# Patient Record
Sex: Male | Born: 1945 | Race: White | Hispanic: No | Marital: Married | Smoking: Former smoker
Health system: Southern US, Community
[De-identification: ages and names within clinical notes are randomized; demographics above are authoritative.]

## PROBLEM LIST (undated history)

## (undated) DIAGNOSIS — M5030 Other cervical disc degeneration, unspecified cervical region: Secondary | ICD-10-CM

## (undated) DIAGNOSIS — N400 Enlarged prostate without lower urinary tract symptoms: Secondary | ICD-10-CM

## (undated) DIAGNOSIS — D369 Benign neoplasm, unspecified site: Secondary | ICD-10-CM

## (undated) DIAGNOSIS — R972 Elevated prostate specific antigen [PSA]: Secondary | ICD-10-CM

## (undated) DIAGNOSIS — I10 Essential (primary) hypertension: Secondary | ICD-10-CM

## (undated) DIAGNOSIS — T8859XA Other complications of anesthesia, initial encounter: Secondary | ICD-10-CM

## (undated) DIAGNOSIS — E785 Hyperlipidemia, unspecified: Secondary | ICD-10-CM

## (undated) DIAGNOSIS — M720 Palmar fascial fibromatosis [Dupuytren]: Secondary | ICD-10-CM

## (undated) DIAGNOSIS — C801 Malignant (primary) neoplasm, unspecified: Secondary | ICD-10-CM

## (undated) HISTORY — PX: APPENDECTOMY: SHX54

## (undated) HISTORY — PX: FLEXIBLE SIGMOIDOSCOPY: SHX1649

## (undated) HISTORY — PX: ADENOIDECTOMY: SUR15

## (undated) HISTORY — PX: COLONOSCOPY: SHX174

## (undated) HISTORY — PX: ESOPHAGOGASTRODUODENOSCOPY: SHX1529

---

## 2004-10-31 ENCOUNTER — Ambulatory Visit: Payer: Self-pay | Admitting: Otolaryngology

## 2006-05-01 ENCOUNTER — Ambulatory Visit: Payer: Self-pay | Admitting: Unknown Physician Specialty

## 2011-07-18 ENCOUNTER — Ambulatory Visit: Payer: Self-pay | Admitting: Unknown Physician Specialty

## 2011-07-23 LAB — PATHOLOGY REPORT

## 2015-07-18 DIAGNOSIS — I10 Essential (primary) hypertension: Secondary | ICD-10-CM | POA: Diagnosis not present

## 2015-07-18 DIAGNOSIS — E785 Hyperlipidemia, unspecified: Secondary | ICD-10-CM | POA: Diagnosis not present

## 2015-07-18 DIAGNOSIS — Z79899 Other long term (current) drug therapy: Secondary | ICD-10-CM | POA: Diagnosis not present

## 2015-07-18 DIAGNOSIS — E538 Deficiency of other specified B group vitamins: Secondary | ICD-10-CM | POA: Diagnosis not present

## 2015-07-18 DIAGNOSIS — G629 Polyneuropathy, unspecified: Secondary | ICD-10-CM | POA: Diagnosis not present

## 2015-07-25 DIAGNOSIS — E538 Deficiency of other specified B group vitamins: Secondary | ICD-10-CM | POA: Diagnosis not present

## 2015-07-25 DIAGNOSIS — Z0000 Encounter for general adult medical examination without abnormal findings: Secondary | ICD-10-CM | POA: Diagnosis not present

## 2015-07-25 DIAGNOSIS — Z125 Encounter for screening for malignant neoplasm of prostate: Secondary | ICD-10-CM | POA: Diagnosis not present

## 2015-07-25 DIAGNOSIS — N401 Enlarged prostate with lower urinary tract symptoms: Secondary | ICD-10-CM | POA: Diagnosis not present

## 2015-07-25 DIAGNOSIS — M5030 Other cervical disc degeneration, unspecified cervical region: Secondary | ICD-10-CM | POA: Diagnosis not present

## 2015-07-25 DIAGNOSIS — Z23 Encounter for immunization: Secondary | ICD-10-CM | POA: Diagnosis not present

## 2015-07-25 DIAGNOSIS — E785 Hyperlipidemia, unspecified: Secondary | ICD-10-CM | POA: Diagnosis not present

## 2015-07-25 DIAGNOSIS — I10 Essential (primary) hypertension: Secondary | ICD-10-CM | POA: Diagnosis not present

## 2015-07-25 DIAGNOSIS — Z79899 Other long term (current) drug therapy: Secondary | ICD-10-CM | POA: Diagnosis not present

## 2015-08-15 ENCOUNTER — Other Ambulatory Visit: Payer: Self-pay | Admitting: Internal Medicine

## 2015-08-15 DIAGNOSIS — R51 Headache: Secondary | ICD-10-CM | POA: Diagnosis not present

## 2015-08-15 DIAGNOSIS — R519 Headache, unspecified: Secondary | ICD-10-CM

## 2015-08-15 DIAGNOSIS — M5030 Other cervical disc degeneration, unspecified cervical region: Secondary | ICD-10-CM | POA: Diagnosis not present

## 2015-08-16 ENCOUNTER — Ambulatory Visit
Admission: RE | Admit: 2015-08-16 | Discharge: 2015-08-16 | Disposition: A | Discharge status: Home / Self Care | Payer: PPO | Source: Ambulatory Visit | Attending: Internal Medicine | Admitting: Internal Medicine

## 2015-08-16 DIAGNOSIS — R51 Headache: Secondary | ICD-10-CM | POA: Diagnosis not present

## 2015-08-16 DIAGNOSIS — R930 Abnormal findings on diagnostic imaging of skull and head, not elsewhere classified: Secondary | ICD-10-CM | POA: Diagnosis not present

## 2015-08-16 DIAGNOSIS — R519 Headache, unspecified: Secondary | ICD-10-CM

## 2015-08-25 DIAGNOSIS — L0211 Cutaneous abscess of neck: Secondary | ICD-10-CM | POA: Diagnosis not present

## 2015-10-15 DIAGNOSIS — L0211 Cutaneous abscess of neck: Secondary | ICD-10-CM | POA: Diagnosis not present

## 2015-10-15 DIAGNOSIS — X32XXXA Exposure to sunlight, initial encounter: Secondary | ICD-10-CM | POA: Diagnosis not present

## 2015-10-15 DIAGNOSIS — L400 Psoriasis vulgaris: Secondary | ICD-10-CM | POA: Diagnosis not present

## 2015-10-15 DIAGNOSIS — Z08 Encounter for follow-up examination after completed treatment for malignant neoplasm: Secondary | ICD-10-CM | POA: Diagnosis not present

## 2015-10-15 DIAGNOSIS — L309 Dermatitis, unspecified: Secondary | ICD-10-CM | POA: Diagnosis not present

## 2015-10-15 DIAGNOSIS — Z85828 Personal history of other malignant neoplasm of skin: Secondary | ICD-10-CM | POA: Diagnosis not present

## 2015-10-15 DIAGNOSIS — L570 Actinic keratosis: Secondary | ICD-10-CM | POA: Diagnosis not present

## 2015-10-29 DIAGNOSIS — X32XXXA Exposure to sunlight, initial encounter: Secondary | ICD-10-CM | POA: Diagnosis not present

## 2015-10-29 DIAGNOSIS — L570 Actinic keratosis: Secondary | ICD-10-CM | POA: Diagnosis not present

## 2015-10-31 DIAGNOSIS — R6889 Other general symptoms and signs: Secondary | ICD-10-CM | POA: Diagnosis not present

## 2016-01-17 DIAGNOSIS — Z125 Encounter for screening for malignant neoplasm of prostate: Secondary | ICD-10-CM | POA: Diagnosis not present

## 2016-01-17 DIAGNOSIS — I10 Essential (primary) hypertension: Secondary | ICD-10-CM | POA: Diagnosis not present

## 2016-01-17 DIAGNOSIS — Z79899 Other long term (current) drug therapy: Secondary | ICD-10-CM | POA: Diagnosis not present

## 2016-01-17 DIAGNOSIS — E538 Deficiency of other specified B group vitamins: Secondary | ICD-10-CM | POA: Diagnosis not present

## 2016-01-17 DIAGNOSIS — E785 Hyperlipidemia, unspecified: Secondary | ICD-10-CM | POA: Diagnosis not present

## 2016-01-22 DIAGNOSIS — E538 Deficiency of other specified B group vitamins: Secondary | ICD-10-CM | POA: Diagnosis not present

## 2016-01-22 DIAGNOSIS — I10 Essential (primary) hypertension: Secondary | ICD-10-CM | POA: Diagnosis not present

## 2016-01-22 DIAGNOSIS — E785 Hyperlipidemia, unspecified: Secondary | ICD-10-CM | POA: Diagnosis not present

## 2016-01-22 DIAGNOSIS — Z125 Encounter for screening for malignant neoplasm of prostate: Secondary | ICD-10-CM | POA: Diagnosis not present

## 2016-01-22 DIAGNOSIS — Z79899 Other long term (current) drug therapy: Secondary | ICD-10-CM | POA: Diagnosis not present

## 2016-01-22 DIAGNOSIS — M25562 Pain in left knee: Secondary | ICD-10-CM | POA: Diagnosis not present

## 2016-01-29 DIAGNOSIS — E538 Deficiency of other specified B group vitamins: Secondary | ICD-10-CM | POA: Diagnosis not present

## 2016-02-05 DIAGNOSIS — E538 Deficiency of other specified B group vitamins: Secondary | ICD-10-CM | POA: Diagnosis not present

## 2016-02-12 DIAGNOSIS — E538 Deficiency of other specified B group vitamins: Secondary | ICD-10-CM | POA: Diagnosis not present

## 2016-03-14 DIAGNOSIS — E538 Deficiency of other specified B group vitamins: Secondary | ICD-10-CM | POA: Diagnosis not present

## 2016-04-16 DIAGNOSIS — E538 Deficiency of other specified B group vitamins: Secondary | ICD-10-CM | POA: Diagnosis not present

## 2016-05-20 DIAGNOSIS — E538 Deficiency of other specified B group vitamins: Secondary | ICD-10-CM | POA: Diagnosis not present

## 2016-06-20 DIAGNOSIS — E538 Deficiency of other specified B group vitamins: Secondary | ICD-10-CM | POA: Diagnosis not present

## 2016-07-18 DIAGNOSIS — Z125 Encounter for screening for malignant neoplasm of prostate: Secondary | ICD-10-CM | POA: Diagnosis not present

## 2016-07-18 DIAGNOSIS — Z79899 Other long term (current) drug therapy: Secondary | ICD-10-CM | POA: Diagnosis not present

## 2016-07-18 DIAGNOSIS — I10 Essential (primary) hypertension: Secondary | ICD-10-CM | POA: Diagnosis not present

## 2016-07-18 DIAGNOSIS — E538 Deficiency of other specified B group vitamins: Secondary | ICD-10-CM | POA: Diagnosis not present

## 2016-07-18 DIAGNOSIS — E785 Hyperlipidemia, unspecified: Secondary | ICD-10-CM | POA: Diagnosis not present

## 2016-07-29 DIAGNOSIS — E538 Deficiency of other specified B group vitamins: Secondary | ICD-10-CM | POA: Diagnosis not present

## 2016-07-29 DIAGNOSIS — Z79899 Other long term (current) drug therapy: Secondary | ICD-10-CM | POA: Diagnosis not present

## 2016-07-29 DIAGNOSIS — R0989 Other specified symptoms and signs involving the circulatory and respiratory systems: Secondary | ICD-10-CM | POA: Diagnosis not present

## 2016-07-29 DIAGNOSIS — Z0000 Encounter for general adult medical examination without abnormal findings: Secondary | ICD-10-CM | POA: Diagnosis not present

## 2016-07-29 DIAGNOSIS — I10 Essential (primary) hypertension: Secondary | ICD-10-CM | POA: Diagnosis not present

## 2016-07-29 DIAGNOSIS — M5030 Other cervical disc degeneration, unspecified cervical region: Secondary | ICD-10-CM | POA: Diagnosis not present

## 2016-07-29 DIAGNOSIS — E785 Hyperlipidemia, unspecified: Secondary | ICD-10-CM | POA: Diagnosis not present

## 2016-08-19 DIAGNOSIS — R0989 Other specified symptoms and signs involving the circulatory and respiratory systems: Secondary | ICD-10-CM | POA: Diagnosis not present

## 2016-08-19 DIAGNOSIS — I6523 Occlusion and stenosis of bilateral carotid arteries: Secondary | ICD-10-CM | POA: Diagnosis not present

## 2016-08-29 DIAGNOSIS — E538 Deficiency of other specified B group vitamins: Secondary | ICD-10-CM | POA: Diagnosis not present

## 2016-09-12 DIAGNOSIS — Z86010 Personal history of colonic polyps: Secondary | ICD-10-CM | POA: Diagnosis not present

## 2016-09-29 DIAGNOSIS — E538 Deficiency of other specified B group vitamins: Secondary | ICD-10-CM | POA: Diagnosis not present

## 2016-10-30 DIAGNOSIS — E538 Deficiency of other specified B group vitamins: Secondary | ICD-10-CM | POA: Diagnosis not present

## 2016-12-03 ENCOUNTER — Ambulatory Visit: Payer: PPO | Admitting: Anesthesiology

## 2016-12-03 ENCOUNTER — Ambulatory Visit
Admission: RE | Admit: 2016-12-03 | Discharge: 2016-12-03 | Disposition: A | Discharge status: Home / Self Care | Payer: PPO | Source: Ambulatory Visit | Attending: Unknown Physician Specialty | Admitting: Unknown Physician Specialty

## 2016-12-03 ENCOUNTER — Encounter
Admission: RE | Disposition: A | Discharge status: Home / Self Care | Payer: Self-pay | Source: Ambulatory Visit | Attending: Unknown Physician Specialty

## 2016-12-03 DIAGNOSIS — Z86010 Personal history of colonic polyps: Secondary | ICD-10-CM | POA: Insufficient documentation

## 2016-12-03 DIAGNOSIS — M5030 Other cervical disc degeneration, unspecified cervical region: Secondary | ICD-10-CM | POA: Insufficient documentation

## 2016-12-03 DIAGNOSIS — Z1211 Encounter for screening for malignant neoplasm of colon: Secondary | ICD-10-CM | POA: Insufficient documentation

## 2016-12-03 DIAGNOSIS — Z79899 Other long term (current) drug therapy: Secondary | ICD-10-CM | POA: Insufficient documentation

## 2016-12-03 DIAGNOSIS — Z7982 Long term (current) use of aspirin: Secondary | ICD-10-CM | POA: Diagnosis not present

## 2016-12-03 DIAGNOSIS — I10 Essential (primary) hypertension: Secondary | ICD-10-CM | POA: Insufficient documentation

## 2016-12-03 DIAGNOSIS — Z87891 Personal history of nicotine dependence: Secondary | ICD-10-CM | POA: Diagnosis not present

## 2016-12-03 DIAGNOSIS — K648 Other hemorrhoids: Secondary | ICD-10-CM | POA: Diagnosis not present

## 2016-12-03 DIAGNOSIS — N400 Enlarged prostate without lower urinary tract symptoms: Secondary | ICD-10-CM | POA: Insufficient documentation

## 2016-12-03 DIAGNOSIS — E785 Hyperlipidemia, unspecified: Secondary | ICD-10-CM | POA: Diagnosis not present

## 2016-12-03 DIAGNOSIS — K640 First degree hemorrhoids: Secondary | ICD-10-CM | POA: Insufficient documentation

## 2016-12-03 HISTORY — DX: Elevated prostate specific antigen (PSA): R97.20

## 2016-12-03 HISTORY — DX: Essential (primary) hypertension: I10

## 2016-12-03 HISTORY — DX: Benign prostatic hyperplasia without lower urinary tract symptoms: N40.0

## 2016-12-03 HISTORY — DX: Hyperlipidemia, unspecified: E78.5

## 2016-12-03 HISTORY — DX: Other cervical disc degeneration, unspecified cervical region: M50.30

## 2016-12-03 HISTORY — PX: COLONOSCOPY WITH PROPOFOL: SHX5780

## 2016-12-03 HISTORY — DX: Benign neoplasm, unspecified site: D36.9

## 2016-12-03 SURGERY — COLONOSCOPY WITH PROPOFOL
Anesthesia: General

## 2016-12-03 MED ORDER — PROPOFOL 10 MG/ML IV BOLUS
INTRAVENOUS | Status: DC | PRN
  Administered 2016-12-03: 90 mg via INTRAVENOUS

## 2016-12-03 MED ORDER — PROPOFOL 500 MG/50ML IV EMUL
INTRAVENOUS | Status: AC
Start: 2016-12-03 — End: 2016-12-03
  Filled 2016-12-03: qty 50

## 2016-12-03 MED ORDER — SODIUM CHLORIDE 0.9 % IV SOLN
INTRAVENOUS | Status: DC
  Administered 2016-12-03: 09:00:00 via INTRAVENOUS

## 2016-12-03 MED ORDER — PROPOFOL 500 MG/50ML IV EMUL
INTRAVENOUS | Status: DC | PRN
  Administered 2016-12-03: 100 ug/kg/min via INTRAVENOUS

## 2016-12-03 MED ORDER — SODIUM CHLORIDE 0.9 % IV SOLN: INTRAVENOUS | Status: DC

## 2016-12-03 NOTE — Anesthesia Preprocedure Evaluation (Signed)
Anesthesia Evaluation  Patient identified by MRN, date of birth, ID band Patient awake    Reviewed: Allergy & Precautions, H&P , NPO status , Patient's Chart, lab work & pertinent test results, reviewed documented beta blocker date and time   History of Anesthesia Complications Negative for: history of anesthetic complications  Airway Mallampati: III  TM Distance: >3 FB Neck ROM: full    Dental  (+) Caps, Chipped   Pulmonary neg pulmonary ROS, former smoker,           Cardiovascular Exercise Tolerance: Good negative cardio ROS  (-) dysrhythmias      Neuro/Psych negative neurological ROS  negative psych ROS   GI/Hepatic negative GI ROS, Neg liver ROS,   Endo/Other  negative endocrine ROS  Renal/GU negative Renal ROS  negative genitourinary   Musculoskeletal   Abdominal   Peds  Hematology negative hematology ROS (+)   Anesthesia Other Findings Past Medical History: No date: Adenomatous polyps No date: BPH (benign prostatic hyperplasia) No date: DDD (degenerative disc disease), cervical No date: Elevated PSA No date: Hyperlipidemia No date: Hypertension   Reproductive/Obstetrics negative OB ROS                             Anesthesia Physical Anesthesia Plan  ASA: II  Anesthesia Plan: General   Post-op Pain Management:    Induction:   Airway Management Planned:   Additional Equipment:   Intra-op Plan:   Post-operative Plan:   Informed Consent: I have reviewed the patients History and Physical, chart, labs and discussed the procedure including the risks, benefits and alternatives for the proposed anesthesia with the patient or authorized representative who has indicated his/her understanding and acceptance.   Dental Advisory Given  Plan Discussed with: Anesthesiologist, CRNA and Surgeon  Anesthesia Plan Comments:         Anesthesia Quick Evaluation

## 2016-12-03 NOTE — Op Note (Signed)
[facility] Patient Name: Alan Brock Procedure Date: 12/03/2016 9:13 AM MRN: [id number] Account #: [id number] Date of Birth: 10-06-1945 Admit Type: Outpatient Age: 71 Room: [facility] ENDO ROOM 3 Gender: Male Note Status: Finalized Procedure:            Colonoscopy Indications:          High risk colon cancer surveillance: Personal history                        of colonic polyps Providers:            Manya Silvas, MD Referring MD:         Leonie Douglas. Doy Hutching, MD (Referring MD) Medicines:            Propofol per Anesthesia Complications:        No immediate complications. Procedure:            Pre-Anesthesia Assessment:                       - After reviewing the risks and benefits, the patient                        was deemed in satisfactory condition to undergo the                        procedure.                       After obtaining informed consent, the colonoscope was                        passed under direct vision. Throughout the procedure,                        the patient's blood pressure, pulse, and oxygen                        saturations were monitored continuously. The                        Colonoscope was introduced through the anus and                        advanced to the the cecum, identified by appendiceal                        orifice and ileocecal valve. The patient tolerated the                        procedure well. The quality of the bowel preparation                        was excellent. Findings:      Internal hemorrhoids were found during endoscopy. The hemorrhoids were       small and Grade I (internal hemorrhoids that do not prolapse).      The exam was otherwise without abnormality. Impression:           - Internal hemorrhoids.                       - The examination was otherwise  normal.                       - No specimens collected. Recommendation:       - Repeat colonoscopy in 5 years for  surveillance. Manya Silvas, MD 12/03/2016 9:43:07 AM This report has been signed electronically. Number of Addenda: 0 Note Initiated On: 12/03/2016 9:13 AM Scope Withdrawal Time: 0 hours 5 minutes 16 seconds  Total Procedure Duration: 0 hours 17 minutes 13 seconds       [facility]

## 2016-12-03 NOTE — Transfer of Care (Signed)
Immediate Anesthesia Transfer of Care Note  Patient: Alan Brock  Procedure(s) Performed: Procedure(s): COLONOSCOPY WITH PROPOFOL (N/A)  Patient Location: PACU and Endoscopy Unit  Anesthesia Type:General  Level of Consciousness: drowsy and patient cooperative  Airway & Oxygen Therapy: Patient Spontanous Breathing and Patient connected to nasal cannula oxygen  Post-op Assessment: Report given to RN and Post -op Vital signs reviewed and stable  Post vital signs: Reviewed and stable  Last Vitals:  Vitals:   12/03/16 0855 12/03/16 0944  BP: (!) 161/74 (!) 95/50  Pulse: 72 61  Resp: 16 18  Temp: 36.2 C (!) 36 C    Last Pain:  Vitals:   12/03/16 0944  TempSrc: Tympanic         Complications: No apparent anesthesia complications

## 2016-12-03 NOTE — Anesthesia Post-op Follow-up Note (Cosign Needed)
Anesthesia QCDR form completed.        

## 2016-12-03 NOTE — H&P (Signed)
   Primary Care Physician:  Idelle Crouch, MD Primary Gastroenterologist:  Dr. Vira Agar  Pre-Procedure History & Physical: HPI:  Alan Brock is a 71 y.o. male is here for an colonoscopy.   Past Medical History:  Diagnosis Date  . Adenomatous polyps   . BPH (benign prostatic hyperplasia)   . DDD (degenerative disc disease), cervical   . Elevated PSA   . Hyperlipidemia   . Hypertension     Past Surgical History:  Procedure Laterality Date  . APPENDECTOMY    . COLONOSCOPY    . ESOPHAGOGASTRODUODENOSCOPY    . FLEXIBLE SIGMOIDOSCOPY      Prior to Admission medications   Medication Sig Start Date End Date Taking? Authorizing Provider  aspirin 81 MG chewable tablet Chew 81 mg by mouth daily.   Yes [provider]  Cyanocobalamin (VITAMIN B-12 IJ) Inject as directed every 30 (thirty) days.   Yes [provider]  acetaminophen (TYLENOL) 325 MG tablet Take 650 mg by mouth every 6 (six) hours as needed.    [provider]  b complex vitamins capsule Take 1 capsule by mouth daily.    [provider]    Allergies as of 09/22/2016  . (No Known Allergies)    History reviewed. No pertinent family history.  Social History   Social History  . Marital status: Married    Spouse name: N/A  . Number of children: N/A  . Years of education: N/A   Occupational History  . Not on file.   Social History Main Topics  . Smoking status: Former Research scientist (life sciences)  . Smokeless tobacco: Current User  . Alcohol use Yes     Comment: 4 daily  . Drug use: No     Comment: unknown  . Sexual activity: Not on file   Other Topics Concern  . Not on file   Social History Narrative  . No narrative on file    Review of Systems: See HPI, otherwise negative ROS  Physical Exam: BP (!) 161/74   Pulse 72   Temp 97.2 F (36.2 C) (Tympanic)   Resp 16   Ht 5\' 7"  (1.702 m)   Wt 70.3 kg (155 lb)   SpO2 97%   BMI 24.28 kg/m  General:   Alert,  pleasant and  cooperative in NAD Head:  Normocephalic and atraumatic. Neck:  Supple; no masses or thyromegaly. Lungs:  Clear throughout to auscultation.    Heart:  Regular rate and rhythm. Abdomen:  Soft, nontender and nondistended. Normal bowel sounds, without guarding, and without rebound.   Neurologic:  Alert and  oriented x4;  grossly normal neurologically.  Impression/Plan: Alan Brock is here for an colonoscopy to be performed for [facility] colon polyps  Risks, benefits, limitations, and alternatives regarding  colonoscopy have been reviewed with the patient.  Questions have been answered.  All parties agreeable.   Gaylyn Cheers, MD  12/03/2016, 9:07 AM

## 2016-12-04 ENCOUNTER — Encounter: Payer: Self-pay | Admitting: Unknown Physician Specialty

## 2016-12-04 NOTE — Anesthesia Postprocedure Evaluation (Signed)
Anesthesia Post Note  Patient: Alan Brock  Procedure(s) Performed: Procedure(s) (LRB): COLONOSCOPY WITH PROPOFOL (N/A)  Patient location during evaluation: Endoscopy Anesthesia Type: General Level of consciousness: awake and alert Pain management: pain level controlled Vital Signs Assessment: post-procedure vital signs reviewed and stable Respiratory status: spontaneous breathing, nonlabored ventilation, respiratory function stable and patient connected to nasal cannula oxygen Cardiovascular status: blood pressure returned to baseline and stable Postop Assessment: no signs of nausea or vomiting Anesthetic complications: no     Last Vitals:  Vitals:   12/03/16 1004 12/03/16 1014  BP: (!) 146/92 (!) 147/88  Pulse: 61 (!) 57  Resp: 16 16  Temp:      Last Pain:  Vitals:   12/03/16 0944  TempSrc: Tympanic                 Martha Clan

## 2016-12-10 DIAGNOSIS — E538 Deficiency of other specified B group vitamins: Secondary | ICD-10-CM | POA: Diagnosis not present

## 2017-01-12 DIAGNOSIS — E538 Deficiency of other specified B group vitamins: Secondary | ICD-10-CM | POA: Diagnosis not present

## 2017-01-19 DIAGNOSIS — Z79899 Other long term (current) drug therapy: Secondary | ICD-10-CM | POA: Diagnosis not present

## 2017-01-19 DIAGNOSIS — E785 Hyperlipidemia, unspecified: Secondary | ICD-10-CM | POA: Diagnosis not present

## 2017-01-19 DIAGNOSIS — E538 Deficiency of other specified B group vitamins: Secondary | ICD-10-CM | POA: Diagnosis not present

## 2017-01-19 DIAGNOSIS — I10 Essential (primary) hypertension: Secondary | ICD-10-CM | POA: Diagnosis not present

## 2017-01-26 DIAGNOSIS — E538 Deficiency of other specified B group vitamins: Secondary | ICD-10-CM | POA: Diagnosis not present

## 2017-01-26 DIAGNOSIS — I10 Essential (primary) hypertension: Secondary | ICD-10-CM | POA: Diagnosis not present

## 2017-01-26 DIAGNOSIS — Z79899 Other long term (current) drug therapy: Secondary | ICD-10-CM | POA: Diagnosis not present

## 2017-01-26 DIAGNOSIS — E785 Hyperlipidemia, unspecified: Secondary | ICD-10-CM | POA: Diagnosis not present

## 2017-01-26 DIAGNOSIS — Z125 Encounter for screening for malignant neoplasm of prostate: Secondary | ICD-10-CM | POA: Diagnosis not present

## 2017-01-26 DIAGNOSIS — Z0000 Encounter for general adult medical examination without abnormal findings: Secondary | ICD-10-CM | POA: Diagnosis not present

## 2017-02-12 DIAGNOSIS — E538 Deficiency of other specified B group vitamins: Secondary | ICD-10-CM | POA: Diagnosis not present

## 2017-03-05 IMAGING — CT CT HEAD W/O CM
1 series · 16 of 29 positions shown, 20 images · non-contrast
Comparison: None.

CLINICAL DATA: Acute nonintractable headache.

EXAM:
CT HEAD WITHOUT CONTRAST
TECHNIQUE: Contiguous axial images were obtained from the base of the skull
through the vertex without intravenous contrast.

[Series 2: head wo · axial · 0.41mm/px · z∈[-146,-16]mm · 16 of 29 slices shown, 20 images]
[im 2/29  brain]
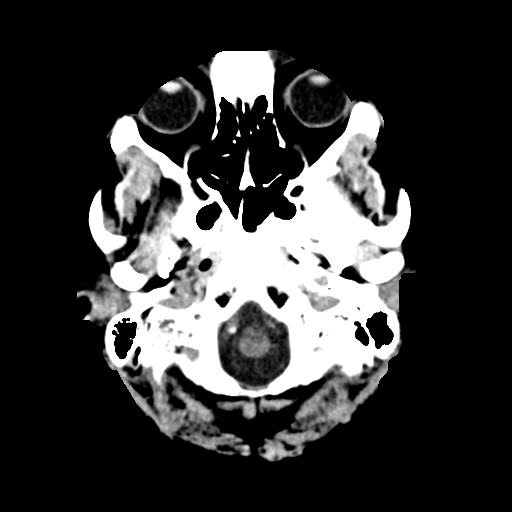
[im 2/29  bone]
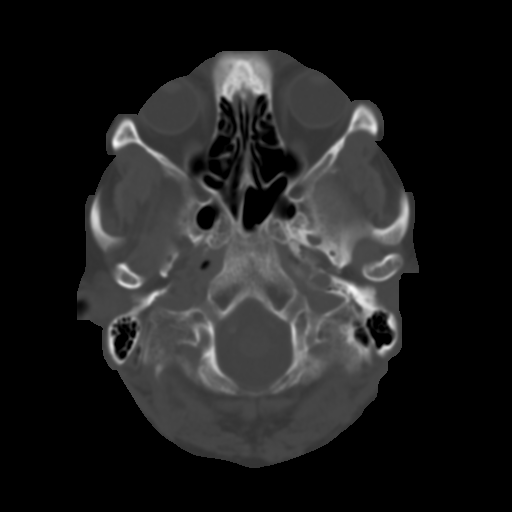
[im 4/29  brain]
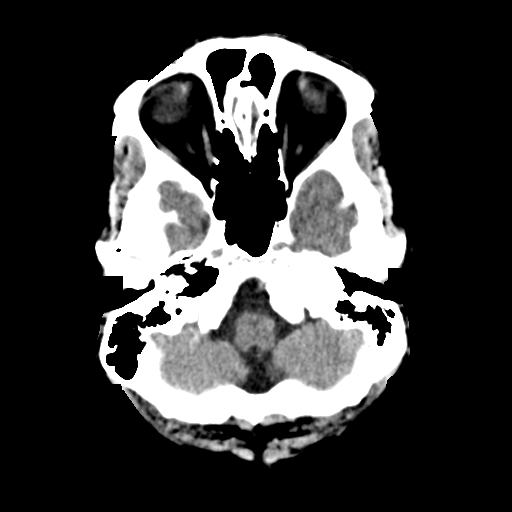
[im 6/29  brain]
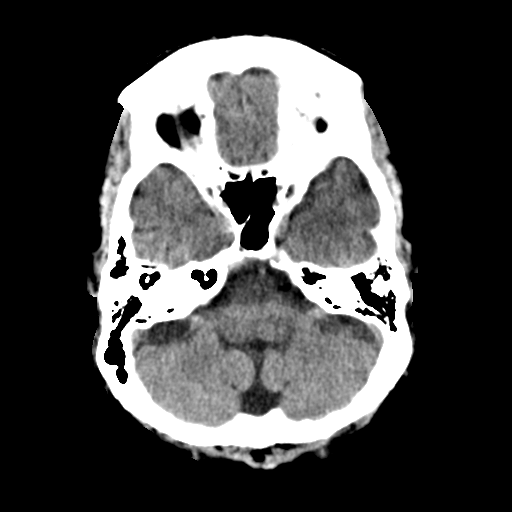
[im 7/29  brain]
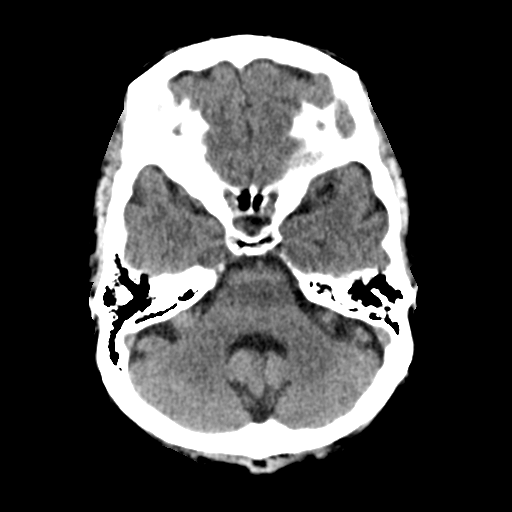
[im 9/29  brain]
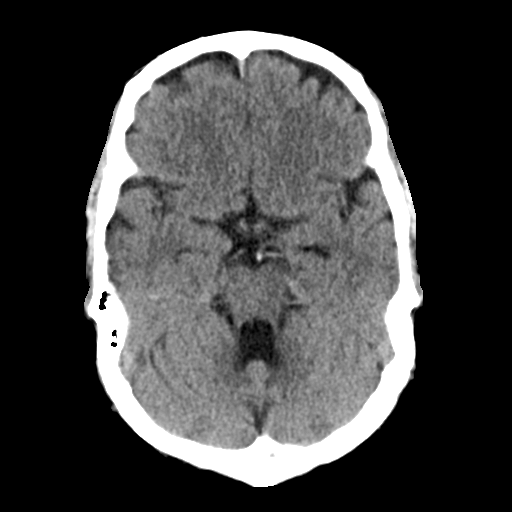
[im 9/29  bone]
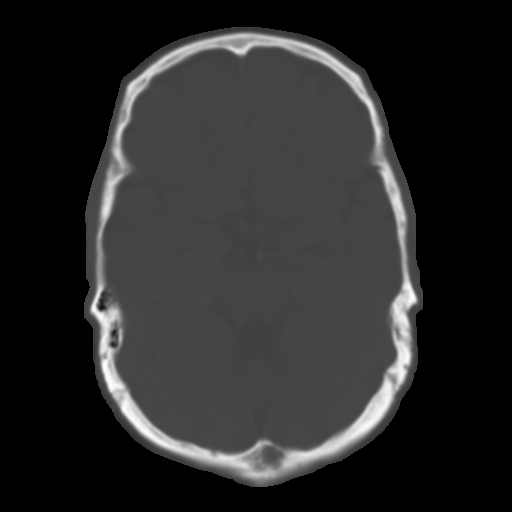
[im 11/29  brain]
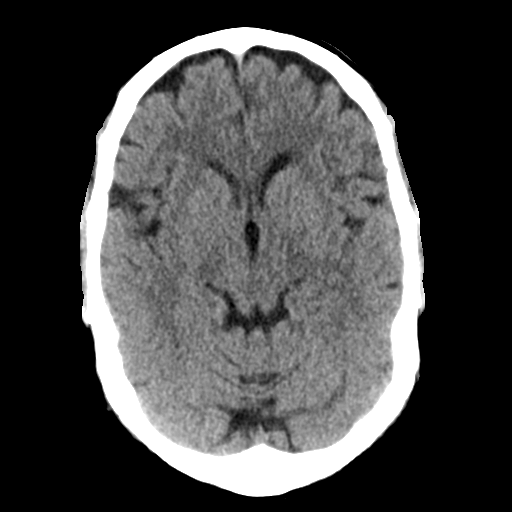
[im 12/29  brain]
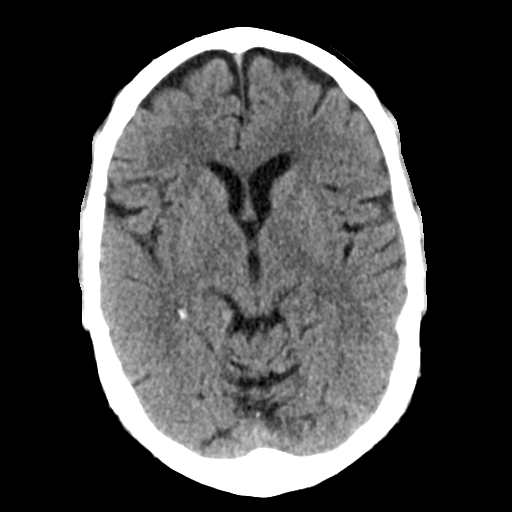
[im 14/29  brain]
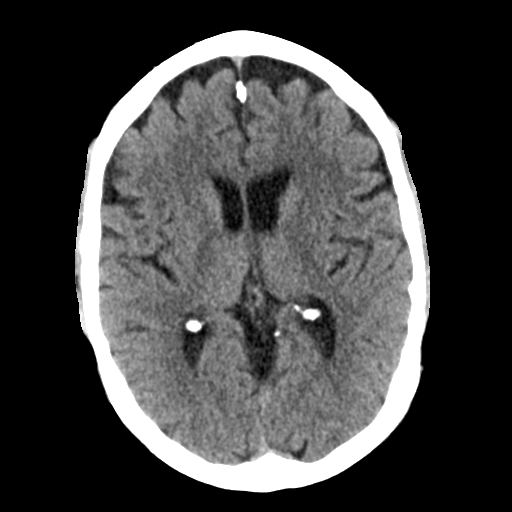
[im 16/29  brain]
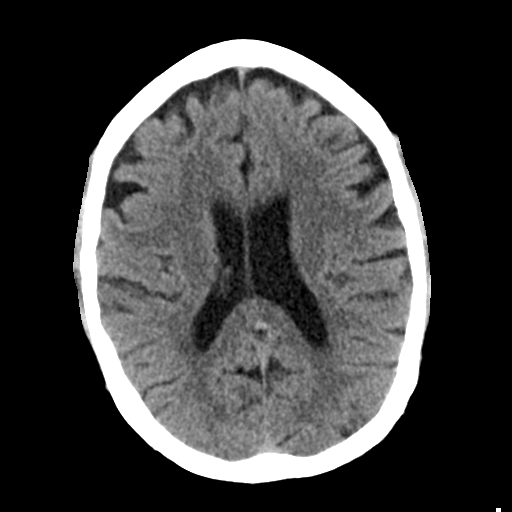
[im 16/29  bone]
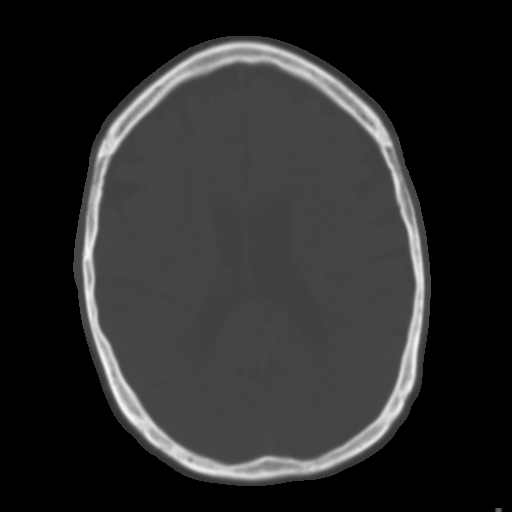
[im 18/29  brain]
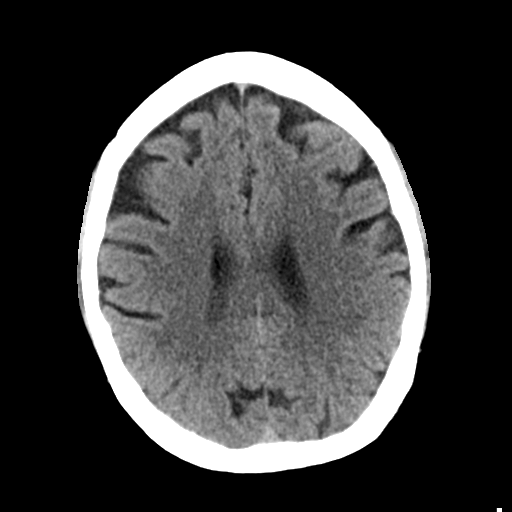
[im 19/29  brain]
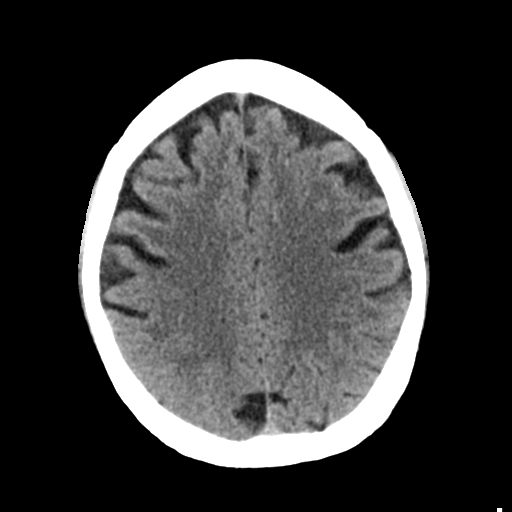
[im 21/29  brain]
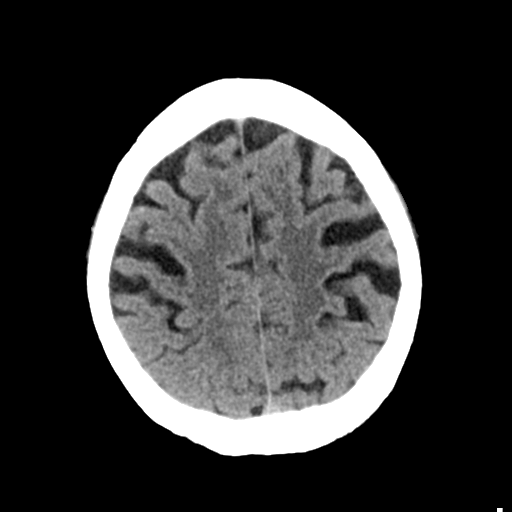
[im 23/29  brain]
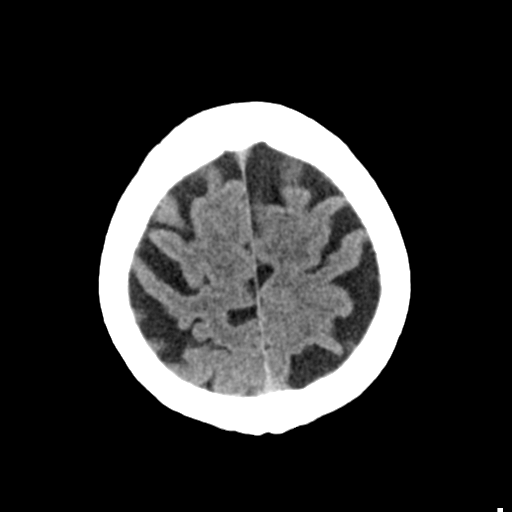
[im 23/29  bone]
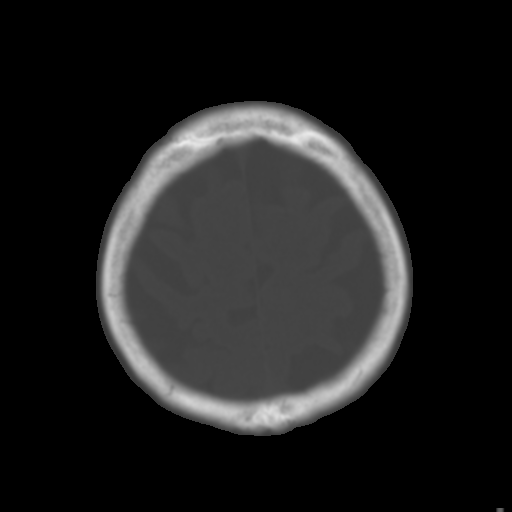
[im 24/29  brain]
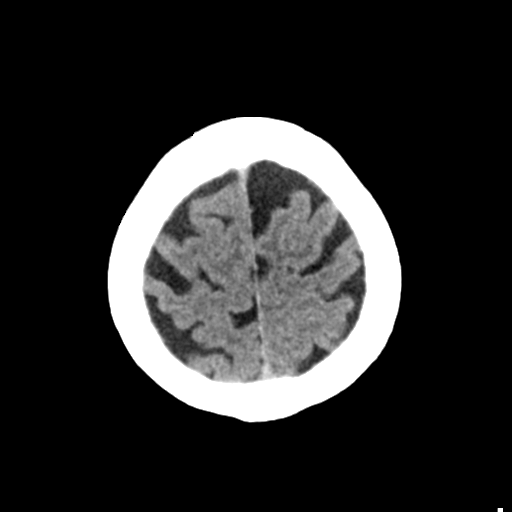
[im 26/29  brain]
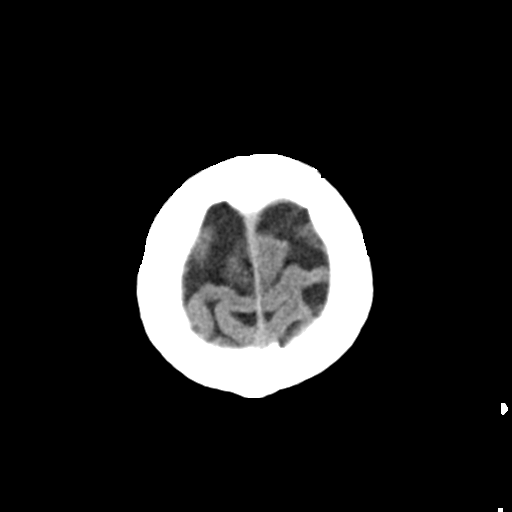
[im 28/29  brain]
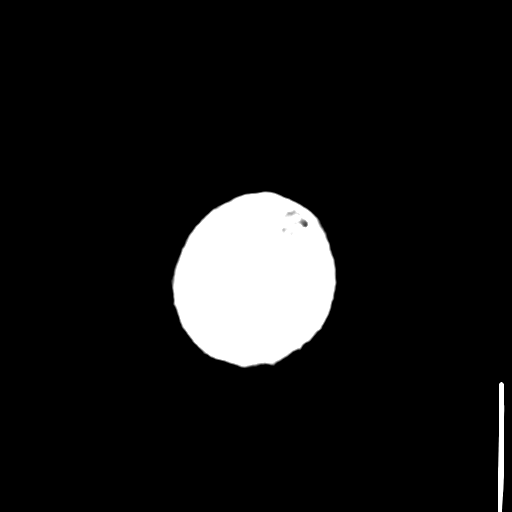

[16 of 29 positions shown; findings below may reference images not displayed]

FINDINGS: Bony calvarium appears intact. Mild diffuse cortical atrophy is
noted. Mild chronic ischemic white matter disease is noted. No mass
effect or midline shift is noted. Ventricular size is within normal
limits. There is no evidence of mass lesion, hemorrhage or acute
infarction.
IMPRESSION: Mild diffuse cortical atrophy. Mild chronic ischemic white matter
disease. No acute intracranial abnormality seen.

## 2017-03-19 DIAGNOSIS — E538 Deficiency of other specified B group vitamins: Secondary | ICD-10-CM | POA: Diagnosis not present

## 2017-04-20 DIAGNOSIS — E538 Deficiency of other specified B group vitamins: Secondary | ICD-10-CM | POA: Diagnosis not present

## 2017-05-21 DIAGNOSIS — E538 Deficiency of other specified B group vitamins: Secondary | ICD-10-CM | POA: Diagnosis not present

## 2017-07-23 DIAGNOSIS — E538 Deficiency of other specified B group vitamins: Secondary | ICD-10-CM | POA: Diagnosis not present

## 2017-07-23 DIAGNOSIS — E785 Hyperlipidemia, unspecified: Secondary | ICD-10-CM | POA: Diagnosis not present

## 2017-07-23 DIAGNOSIS — Z125 Encounter for screening for malignant neoplasm of prostate: Secondary | ICD-10-CM | POA: Diagnosis not present

## 2017-07-23 DIAGNOSIS — Z79899 Other long term (current) drug therapy: Secondary | ICD-10-CM | POA: Diagnosis not present

## 2017-07-30 DIAGNOSIS — F4321 Adjustment disorder with depressed mood: Secondary | ICD-10-CM | POA: Diagnosis not present

## 2017-07-30 DIAGNOSIS — E785 Hyperlipidemia, unspecified: Secondary | ICD-10-CM | POA: Diagnosis not present

## 2017-07-30 DIAGNOSIS — I10 Essential (primary) hypertension: Secondary | ICD-10-CM | POA: Diagnosis not present

## 2017-07-30 DIAGNOSIS — Z0000 Encounter for general adult medical examination without abnormal findings: Secondary | ICD-10-CM | POA: Diagnosis not present

## 2017-07-30 DIAGNOSIS — E538 Deficiency of other specified B group vitamins: Secondary | ICD-10-CM | POA: Diagnosis not present

## 2017-07-30 DIAGNOSIS — L739 Follicular disorder, unspecified: Secondary | ICD-10-CM | POA: Diagnosis not present

## 2017-09-01 DIAGNOSIS — E538 Deficiency of other specified B group vitamins: Secondary | ICD-10-CM | POA: Diagnosis not present

## 2017-10-02 DIAGNOSIS — E538 Deficiency of other specified B group vitamins: Secondary | ICD-10-CM | POA: Diagnosis not present

## 2017-11-04 DIAGNOSIS — E538 Deficiency of other specified B group vitamins: Secondary | ICD-10-CM | POA: Diagnosis not present

## 2017-11-07 DIAGNOSIS — J309 Allergic rhinitis, unspecified: Secondary | ICD-10-CM | POA: Diagnosis not present

## 2017-11-18 DIAGNOSIS — Z0000 Encounter for general adult medical examination without abnormal findings: Secondary | ICD-10-CM | POA: Diagnosis not present

## 2017-11-18 DIAGNOSIS — I10 Essential (primary) hypertension: Secondary | ICD-10-CM | POA: Diagnosis not present

## 2017-11-18 DIAGNOSIS — E785 Hyperlipidemia, unspecified: Secondary | ICD-10-CM | POA: Diagnosis not present

## 2017-11-18 DIAGNOSIS — E538 Deficiency of other specified B group vitamins: Secondary | ICD-10-CM | POA: Diagnosis not present

## 2017-11-18 DIAGNOSIS — Z79899 Other long term (current) drug therapy: Secondary | ICD-10-CM | POA: Diagnosis not present

## 2017-11-18 DIAGNOSIS — Z125 Encounter for screening for malignant neoplasm of prostate: Secondary | ICD-10-CM | POA: Diagnosis not present

## 2017-11-18 DIAGNOSIS — M79642 Pain in left hand: Secondary | ICD-10-CM | POA: Diagnosis not present

## 2017-11-18 DIAGNOSIS — M79641 Pain in right hand: Secondary | ICD-10-CM | POA: Diagnosis not present

## 2017-11-25 DIAGNOSIS — M720 Palmar fascial fibromatosis [Dupuytren]: Secondary | ICD-10-CM | POA: Diagnosis not present

## 2017-12-08 DIAGNOSIS — E538 Deficiency of other specified B group vitamins: Secondary | ICD-10-CM | POA: Diagnosis not present

## 2017-12-08 DIAGNOSIS — H2513 Age-related nuclear cataract, bilateral: Secondary | ICD-10-CM | POA: Diagnosis not present

## 2018-01-08 DIAGNOSIS — E538 Deficiency of other specified B group vitamins: Secondary | ICD-10-CM | POA: Diagnosis not present

## 2018-01-21 DIAGNOSIS — H26112 Localized traumatic opacities, left eye: Secondary | ICD-10-CM | POA: Diagnosis not present

## 2018-01-24 DIAGNOSIS — L02416 Cutaneous abscess of left lower limb: Secondary | ICD-10-CM | POA: Diagnosis not present

## 2018-02-01 ENCOUNTER — Encounter: Payer: Self-pay | Admitting: *Deleted

## 2018-02-02 ENCOUNTER — Ambulatory Visit
Admission: RE | Admit: 2018-02-02 | Discharge: 2018-02-02 | Disposition: A | Discharge status: Home / Self Care | Payer: PPO | Source: Ambulatory Visit | Attending: Ophthalmology | Admitting: Ophthalmology

## 2018-02-02 ENCOUNTER — Encounter: Payer: Self-pay | Admitting: Anesthesiology

## 2018-02-02 ENCOUNTER — Ambulatory Visit: Payer: PPO | Admitting: Anesthesiology

## 2018-02-02 ENCOUNTER — Encounter
Admission: RE | Disposition: A | Discharge status: Home / Self Care | Payer: Self-pay | Source: Ambulatory Visit | Attending: Ophthalmology

## 2018-02-02 DIAGNOSIS — Z7982 Long term (current) use of aspirin: Secondary | ICD-10-CM | POA: Insufficient documentation

## 2018-02-02 DIAGNOSIS — H2512 Age-related nuclear cataract, left eye: Secondary | ICD-10-CM | POA: Diagnosis not present

## 2018-02-02 DIAGNOSIS — E538 Deficiency of other specified B group vitamins: Secondary | ICD-10-CM | POA: Insufficient documentation

## 2018-02-02 DIAGNOSIS — Z87891 Personal history of nicotine dependence: Secondary | ICD-10-CM | POA: Diagnosis not present

## 2018-02-02 DIAGNOSIS — Z79899 Other long term (current) drug therapy: Secondary | ICD-10-CM | POA: Diagnosis not present

## 2018-02-02 DIAGNOSIS — H26112 Localized traumatic opacities, left eye: Secondary | ICD-10-CM | POA: Diagnosis not present

## 2018-02-02 HISTORY — PX: CATARACT EXTRACTION W/PHACO: SHX586

## 2018-02-02 HISTORY — DX: Malignant (primary) neoplasm, unspecified: C80.1

## 2018-02-02 SURGERY — PHACOEMULSIFICATION, CATARACT, WITH IOL INSERTION
Anesthesia: Monitor Anesthesia Care | Site: Eye | Laterality: Left | Wound class: "Clean "

## 2018-02-02 MED ORDER — NA CHONDROIT SULF-NA HYALURON 40-17 MG/ML IO SOLN
INTRAOCULAR | Status: AC
  Filled 2018-02-02: qty 1

## 2018-02-02 MED ORDER — CARBACHOL 0.01 % IO SOLN
INTRAOCULAR | Status: DC | PRN
  Administered 2018-02-02: 0.5 mL via INTRAOCULAR

## 2018-02-02 MED ORDER — LIDOCAINE HCL (PF) 4 % IJ SOLN
INTRAMUSCULAR | Status: AC
  Filled 2018-02-02: qty 5

## 2018-02-02 MED ORDER — PHENYLEPHRINE HCL 10 % OP SOLN
OPHTHALMIC | Status: AC
  Administered 2018-02-02: 1 [drp] via OPHTHALMIC
  Filled 2018-02-02: qty 5

## 2018-02-02 MED ORDER — POVIDONE-IODINE 5 % OP SOLN
OPHTHALMIC | Status: AC
  Filled 2018-02-02: qty 30

## 2018-02-02 MED ORDER — TETRACAINE HCL 0.5 % OP SOLN
OPHTHALMIC | Status: AC
  Administered 2018-02-02: 1 [drp] via OPHTHALMIC
  Filled 2018-02-02: qty 4

## 2018-02-02 MED ORDER — MOXIFLOXACIN HCL 0.5 % OP SOLN
OPHTHALMIC | Status: DC | PRN
  Administered 2018-02-02: 0.2 mL via OPHTHALMIC

## 2018-02-02 MED ORDER — CYCLOPENTOLATE HCL 2 % OP SOLN
1.0000 [drp] | OPHTHALMIC | Status: AC
  Administered 2018-02-02 (×4): 1 [drp] via OPHTHALMIC

## 2018-02-02 MED ORDER — MOXIFLOXACIN HCL 0.5 % OP SOLN
OPHTHALMIC | Status: AC
  Filled 2018-02-02: qty 3

## 2018-02-02 MED ORDER — NA CHONDROIT SULF-NA HYALURON 40-17 MG/ML IO SOLN
INTRAOCULAR | Status: DC | PRN
  Administered 2018-02-02: 1 mL via INTRAOCULAR

## 2018-02-02 MED ORDER — MIDAZOLAM HCL 2 MG/2ML IJ SOLN
INTRAMUSCULAR | Status: AC
  Filled 2018-02-02: qty 2

## 2018-02-02 MED ORDER — MIDAZOLAM HCL 2 MG/2ML IJ SOLN
INTRAMUSCULAR | Status: DC | PRN
  Administered 2018-02-02: 2 mg via INTRAVENOUS

## 2018-02-02 MED ORDER — CYCLOPENTOLATE HCL 2 % OP SOLN
OPHTHALMIC | Status: AC
  Administered 2018-02-02: 1 [drp] via OPHTHALMIC
  Filled 2018-02-02: qty 2

## 2018-02-02 MED ORDER — TETRACAINE HCL 0.5 % OP SOLN
1.0000 [drp] | Freq: Once | OPHTHALMIC | Status: AC
  Administered 2018-02-02: 1 [drp] via OPHTHALMIC

## 2018-02-02 MED ORDER — EPINEPHRINE PF 1 MG/ML IJ SOLN
INTRAMUSCULAR | Status: AC
  Filled 2018-02-02: qty 2

## 2018-02-02 MED ORDER — LIDOCAINE HCL (PF) 4 % IJ SOLN
INTRAOCULAR | Status: DC | PRN
  Administered 2018-02-02: 4 mL via OPHTHALMIC

## 2018-02-02 MED ORDER — EPINEPHRINE PF 1 MG/ML IJ SOLN
INTRAOCULAR | Status: DC | PRN
  Administered 2018-02-02: 10:00:00 via OPHTHALMIC

## 2018-02-02 MED ORDER — MOXIFLOXACIN HCL 0.5 % OP SOLN: 1.0000 [drp] | OPHTHALMIC | Status: DC | PRN

## 2018-02-02 MED ORDER — PHENYLEPHRINE HCL 10 % OP SOLN
1.0000 [drp] | OPHTHALMIC | Status: AC
  Administered 2018-02-02 (×4): 1 [drp] via OPHTHALMIC

## 2018-02-02 MED ORDER — SODIUM CHLORIDE 0.9 % IV SOLN
INTRAVENOUS | Status: DC
  Administered 2018-02-02: 09:00:00 via INTRAVENOUS

## 2018-02-02 MED ORDER — POVIDONE-IODINE 5 % OP SOLN
OPHTHALMIC | Status: DC | PRN
  Administered 2018-02-02: 1 via OPHTHALMIC

## 2018-02-02 SURGICAL SUPPLY — 16 items
GLOVE BIO SURGEON STRL SZ8 (GLOVE) ×2 IMPLANT
GLOVE BIOGEL M 6.5 STRL (GLOVE) ×2 IMPLANT
GLOVE SURG LX 8.0 MICRO (GLOVE) ×1
GLOVE SURG LX STRL 8.0 MICRO (GLOVE) ×1 IMPLANT
GOWN STRL REUS W/ TWL LRG LVL3 (GOWN DISPOSABLE) ×2 IMPLANT
GOWN STRL REUS W/TWL LRG LVL3 (GOWN DISPOSABLE) ×2
LABEL CATARACT MEDS ST (LABEL) ×2 IMPLANT
LENS IOL TECNIS ITEC 20.0 (Intraocular Lens) ×1 IMPLANT
PACK CATARACT (MISCELLANEOUS) ×2 IMPLANT
PACK CATARACT BRASINGTON LX (MISCELLANEOUS) ×2 IMPLANT
PACK EYE AFTER SURG (MISCELLANEOUS) ×2 IMPLANT
SOL BSS BAG (MISCELLANEOUS) ×2
SOLUTION BSS BAG (MISCELLANEOUS) ×1 IMPLANT
SYR 5ML LL (SYRINGE) ×2 IMPLANT
WATER STERILE IRR 250ML POUR (IV SOLUTION) ×2 IMPLANT
WIPE NON LINTING 3.25X3.25 (MISCELLANEOUS) ×2 IMPLANT

## 2018-02-02 NOTE — Anesthesia Preprocedure Evaluation (Signed)
Anesthesia Evaluation  Patient identified by MRN, date of birth, ID band Patient awake    Reviewed: Allergy & Precautions, NPO status , Patient's Chart, lab work & pertinent test results  History of Anesthesia Complications Negative for: history of anesthetic complications  Airway Mallampati: II  TM Distance: >3 FB Neck ROM: Full    Dental  (+) Caps   Pulmonary neg sleep apnea, neg COPD, former smoker,    breath sounds clear to auscultation- rhonchi (-) wheezing      Cardiovascular Exercise Tolerance: Good hypertension, Pt. on medications (-) CAD, (-) Past MI, (-) Cardiac Stents and (-) CABG  Rhythm:Regular Rate:Normal - Systolic murmurs and - Diastolic murmurs    Neuro/Psych negative neurological ROS  negative psych ROS   GI/Hepatic negative GI ROS, Neg liver ROS,   Endo/Other  negative endocrine ROSneg diabetes  Renal/GU negative Renal ROS     Musculoskeletal  (+) Arthritis ,   Abdominal (+) - obese,   Peds  Hematology negative hematology ROS (+)   Anesthesia Other Findings Past Medical History: No date: Adenomatous polyps No date: BPH (benign prostatic hyperplasia) No date: Cancer (HCC)     Comment:  SKIN No date: DDD (degenerative disc disease), cervical No date: Elevated PSA No date: Hyperlipidemia No date: Hypertension   Reproductive/Obstetrics                             Anesthesia Physical Anesthesia Plan  ASA: II  Anesthesia Plan: MAC   Post-op Pain Management:    Induction: Intravenous  PONV Risk Score and Plan: 1 and Midazolam  Airway Management Planned: Natural Airway  Additional Equipment:   Intra-op Plan:   Post-operative Plan:   Informed Consent: I have reviewed the patients History and Physical, chart, labs and discussed the procedure including the risks, benefits and alternatives for the proposed anesthesia with the patient or authorized  representative who has indicated his/her understanding and acceptance.     Plan Discussed with: CRNA and Anesthesiologist  Anesthesia Plan Comments:         Anesthesia Quick Evaluation

## 2018-02-02 NOTE — Discharge Instructions (Signed)
Eye Surgery Discharge Instructions    Expect mild scratchy sensation or mild soreness. DO NOT RUB YOUR EYE!  The day of surgery:  Minimal physical activity, but bed rest is not required  No reading, computer work, or close hand work  No bending, lifting, or straining.  May watch TV  For 24 hours:  No driving, legal decisions, or alcoholic beverages  Safety precautions  Eat anything you prefer: It is better to start with liquids, then soup then solid foods.  _____ Eye patch should be worn until postoperative exam tomorrow.  ____ Solar shield eyeglasses should be worn for comfort in the sunlight/patch while sleeping  Resume all regular medications including aspirin or Coumadin if these were discontinued prior to surgery. You may shower, bathe, shave, or wash your hair. Tylenol may be taken for mild discomfort.  Call your doctor if you experience significant pain, nausea, or vomiting, fever > 101 or other signs of infection. [phone] or [phone] Specific instructions:  Follow-up Information    Birder Robson, MD Follow up.   Specialty:  Ophthalmology Why:  July 24 at 10:10am Contact information: [address] [place] [address] [phone]

## 2018-02-02 NOTE — Progress Notes (Signed)
Uncharted IV removed from left wrist.

## 2018-02-02 NOTE — Anesthesia Post-op Follow-up Note (Signed)
Anesthesia QCDR form completed.        

## 2018-02-02 NOTE — Transfer of Care (Signed)
Immediate Anesthesia Transfer of Care Note  Patient: Alan Brock  Procedure(s) Performed: CATARACT EXTRACTION PHACO AND INTRAOCULAR LENS PLACEMENT (IOC) (Left Eye)  Patient Location: PACU  Anesthesia Type:MAC  Level of Consciousness: sedated  Airway & Oxygen Therapy: Patient Spontanous Breathing  Post-op Assessment: Report given to RN and Post -op Vital signs reviewed and stable  Post vital signs: Reviewed and stable  Last Vitals:  Vitals Value Taken Time  BP    Temp    Pulse    Resp    SpO2      Last Pain:  Vitals:   02/02/18 0845  PainSc: 0-No pain         Complications: No apparent anesthesia complications

## 2018-02-02 NOTE — H&P (Signed)
All labs reviewed. Abnormal studies sent to patients PCP when indicated.  Previous H&P reviewed, patient examined, there are NO CHANGES.   Porfilio7/23/20199:25 AM

## 2018-02-02 NOTE — Op Note (Signed)
PREOPERATIVE DIAGNOSIS:  Nuclear sclerotic cataract of the left eye.   POSTOPERATIVE DIAGNOSIS:  Nuclear sclerotic cataract of the left eye.   OPERATIVE PROCEDURE: Procedure(s): CATARACT EXTRACTION PHACO AND INTRAOCULAR LENS PLACEMENT (IOC)   SURGEON:  Birder Robson, MD.   ANESTHESIA:  Anesthesiologist: Emmie Niemann, MD CRNA: Justus Memory, CRNA  1.      Managed anesthesia care. 2.     0.54ml of Shugarcaine was instilled following the paracentesis   COMPLICATIONS:  None.   TECHNIQUE:   Stop and chop   DESCRIPTION OF PROCEDURE:  The patient was examined and consented in the preoperative holding area where the aforementioned topical anesthesia was applied to the left eye and then brought back to the Operating Room where the left eye was prepped and draped in the usual sterile ophthalmic fashion and a lid speculum was placed. A paracentesis was created with the side port blade and the anterior chamber was filled with viscoelastic. A near clear corneal incision was performed with the steel keratome. A continuous curvilinear capsulorrhexis was performed with a cystotome followed by the capsulorrhexis forceps. Hydrodissection and hydrodelineation were carried out with BSS on a blunt cannula. The lens was removed in a stop and chop  technique and the remaining cortical material was removed with the irrigation-aspiration handpiece. The capsular bag was inflated with viscoelastic and the Technis [id number] lens was placed in the capsular bag without complication. The remaining viscoelastic was removed from the eye with the irrigation-aspiration handpiece. The wounds were hydrated. The anterior chamber was flushed with Miostat and the eye was inflated to physiologic pressure. 0.84ml Vigamox was placed in the anterior chamber. The wounds were found to be water tight. The eye was dressed with Vigamox. The patient was given protective glasses to wear throughout the day and a shield with which to sleep  tonight. The patient was also given drops with which to begin a drop regimen today and will follow-up with me in one day. Implant Name Type Inv. Item Serial No. Manufacturer Lot No. LRB No. Used  LENS IOL DIOP 20.0 - [id number] Intraocular Lens LENS IOL DIOP 20.0 [id number]  Left 1    Procedure(s) with comments: CATARACT EXTRACTION PHACO AND INTRAOCULAR LENS PLACEMENT (IOC) (Left) - [place] 00:35.6 AP% 14.4 CDE 5.12 Fluid pack lot # [id number]  Electronically signed: Birder Robson 02/02/2018 9:51 AM

## 2018-02-02 NOTE — Anesthesia Postprocedure Evaluation (Signed)
Anesthesia Post Note  Patient: Alan Brock  Procedure(s) Performed: CATARACT EXTRACTION PHACO AND INTRAOCULAR LENS PLACEMENT (IOC) (Left Eye)  Patient location during evaluation: PACU Anesthesia Type: MAC Level of consciousness: awake and alert and oriented Pain management: pain level controlled Vital Signs Assessment: post-procedure vital signs reviewed and stable Respiratory status: spontaneous breathing, nonlabored ventilation and respiratory function stable Cardiovascular status: blood pressure returned to baseline and stable Postop Assessment: no signs of nausea or vomiting Anesthetic complications: no     Last Vitals:  Vitals:   02/02/18 0848 02/02/18 0948  BP:  128/72  Pulse:  61  Resp:  16  Temp: 36.6 C 36.6 C  SpO2:  100%    Last Pain:  Vitals:   02/02/18 0948  TempSrc: Oral  PainSc: 0-No pain                  

## 2018-02-10 DIAGNOSIS — E538 Deficiency of other specified B group vitamins: Secondary | ICD-10-CM | POA: Diagnosis not present

## 2018-02-17 DIAGNOSIS — Z125 Encounter for screening for malignant neoplasm of prostate: Secondary | ICD-10-CM | POA: Diagnosis not present

## 2018-02-17 DIAGNOSIS — E538 Deficiency of other specified B group vitamins: Secondary | ICD-10-CM | POA: Diagnosis not present

## 2018-02-17 DIAGNOSIS — Z79899 Other long term (current) drug therapy: Secondary | ICD-10-CM | POA: Diagnosis not present

## 2018-02-24 DIAGNOSIS — E785 Hyperlipidemia, unspecified: Secondary | ICD-10-CM | POA: Diagnosis not present

## 2018-02-24 DIAGNOSIS — Z125 Encounter for screening for malignant neoplasm of prostate: Secondary | ICD-10-CM | POA: Diagnosis not present

## 2018-02-24 DIAGNOSIS — Z79899 Other long term (current) drug therapy: Secondary | ICD-10-CM | POA: Diagnosis not present

## 2018-02-24 DIAGNOSIS — I10 Essential (primary) hypertension: Secondary | ICD-10-CM | POA: Diagnosis not present

## 2018-02-24 DIAGNOSIS — E538 Deficiency of other specified B group vitamins: Secondary | ICD-10-CM | POA: Diagnosis not present

## 2018-03-01 DIAGNOSIS — L858 Other specified epidermal thickening: Secondary | ICD-10-CM | POA: Diagnosis not present

## 2018-03-01 DIAGNOSIS — L570 Actinic keratosis: Secondary | ICD-10-CM | POA: Diagnosis not present

## 2018-03-01 DIAGNOSIS — D2371 Other benign neoplasm of skin of right lower limb, including hip: Secondary | ICD-10-CM | POA: Diagnosis not present

## 2018-03-01 DIAGNOSIS — Z85828 Personal history of other malignant neoplasm of skin: Secondary | ICD-10-CM | POA: Diagnosis not present

## 2018-03-16 DIAGNOSIS — E538 Deficiency of other specified B group vitamins: Secondary | ICD-10-CM | POA: Diagnosis not present

## 2018-04-16 DIAGNOSIS — E538 Deficiency of other specified B group vitamins: Secondary | ICD-10-CM | POA: Diagnosis not present

## 2018-05-17 DIAGNOSIS — E538 Deficiency of other specified B group vitamins: Secondary | ICD-10-CM | POA: Diagnosis not present

## 2018-06-17 DIAGNOSIS — E538 Deficiency of other specified B group vitamins: Secondary | ICD-10-CM | POA: Diagnosis not present

## 2018-07-19 DIAGNOSIS — E538 Deficiency of other specified B group vitamins: Secondary | ICD-10-CM | POA: Diagnosis not present

## 2018-08-19 DIAGNOSIS — I10 Essential (primary) hypertension: Secondary | ICD-10-CM | POA: Diagnosis not present

## 2018-08-19 DIAGNOSIS — Z125 Encounter for screening for malignant neoplasm of prostate: Secondary | ICD-10-CM | POA: Diagnosis not present

## 2018-08-19 DIAGNOSIS — M7022 Olecranon bursitis, left elbow: Secondary | ICD-10-CM | POA: Diagnosis not present

## 2018-08-19 DIAGNOSIS — Z79899 Other long term (current) drug therapy: Secondary | ICD-10-CM | POA: Diagnosis not present

## 2018-08-19 DIAGNOSIS — E538 Deficiency of other specified B group vitamins: Secondary | ICD-10-CM | POA: Diagnosis not present

## 2018-08-19 DIAGNOSIS — E785 Hyperlipidemia, unspecified: Secondary | ICD-10-CM | POA: Diagnosis not present

## 2018-08-26 DIAGNOSIS — M5431 Sciatica, right side: Secondary | ICD-10-CM | POA: Diagnosis not present

## 2018-08-26 DIAGNOSIS — E785 Hyperlipidemia, unspecified: Secondary | ICD-10-CM | POA: Diagnosis not present

## 2018-08-26 DIAGNOSIS — I10 Essential (primary) hypertension: Secondary | ICD-10-CM | POA: Diagnosis not present

## 2018-08-26 DIAGNOSIS — E538 Deficiency of other specified B group vitamins: Secondary | ICD-10-CM | POA: Diagnosis not present

## 2018-08-26 DIAGNOSIS — M5136 Other intervertebral disc degeneration, lumbar region: Secondary | ICD-10-CM | POA: Diagnosis not present

## 2018-08-26 DIAGNOSIS — Z0000 Encounter for general adult medical examination without abnormal findings: Secondary | ICD-10-CM | POA: Diagnosis not present

## 2018-09-20 DIAGNOSIS — E538 Deficiency of other specified B group vitamins: Secondary | ICD-10-CM | POA: Diagnosis not present

## 2018-11-24 DIAGNOSIS — Z79899 Other long term (current) drug therapy: Secondary | ICD-10-CM | POA: Diagnosis not present

## 2018-11-24 DIAGNOSIS — E538 Deficiency of other specified B group vitamins: Secondary | ICD-10-CM | POA: Diagnosis not present

## 2018-11-24 DIAGNOSIS — I10 Essential (primary) hypertension: Secondary | ICD-10-CM | POA: Diagnosis not present

## 2018-11-24 DIAGNOSIS — Z125 Encounter for screening for malignant neoplasm of prostate: Secondary | ICD-10-CM | POA: Diagnosis not present

## 2018-11-24 DIAGNOSIS — E785 Hyperlipidemia, unspecified: Secondary | ICD-10-CM | POA: Diagnosis not present

## 2018-12-27 DIAGNOSIS — E538 Deficiency of other specified B group vitamins: Secondary | ICD-10-CM | POA: Diagnosis not present

## 2019-01-06 DIAGNOSIS — H2511 Age-related nuclear cataract, right eye: Secondary | ICD-10-CM | POA: Diagnosis not present

## 2019-01-27 DIAGNOSIS — E538 Deficiency of other specified B group vitamins: Secondary | ICD-10-CM | POA: Diagnosis not present

## 2019-02-17 DIAGNOSIS — L570 Actinic keratosis: Secondary | ICD-10-CM | POA: Diagnosis not present

## 2019-02-17 DIAGNOSIS — E538 Deficiency of other specified B group vitamins: Secondary | ICD-10-CM | POA: Diagnosis not present

## 2019-02-17 DIAGNOSIS — Z79899 Other long term (current) drug therapy: Secondary | ICD-10-CM | POA: Diagnosis not present

## 2019-02-17 DIAGNOSIS — I10 Essential (primary) hypertension: Secondary | ICD-10-CM | POA: Diagnosis not present

## 2019-02-17 DIAGNOSIS — E785 Hyperlipidemia, unspecified: Secondary | ICD-10-CM | POA: Diagnosis not present

## 2019-02-17 DIAGNOSIS — Z125 Encounter for screening for malignant neoplasm of prostate: Secondary | ICD-10-CM | POA: Diagnosis not present

## 2019-02-17 DIAGNOSIS — D485 Neoplasm of uncertain behavior of skin: Secondary | ICD-10-CM | POA: Diagnosis not present

## 2019-02-17 DIAGNOSIS — L821 Other seborrheic keratosis: Secondary | ICD-10-CM | POA: Diagnosis not present

## 2019-02-17 DIAGNOSIS — Z85828 Personal history of other malignant neoplasm of skin: Secondary | ICD-10-CM | POA: Diagnosis not present

## 2019-02-24 DIAGNOSIS — I10 Essential (primary) hypertension: Secondary | ICD-10-CM | POA: Diagnosis not present

## 2019-02-24 DIAGNOSIS — M5030 Other cervical disc degeneration, unspecified cervical region: Secondary | ICD-10-CM | POA: Diagnosis not present

## 2019-02-24 DIAGNOSIS — E538 Deficiency of other specified B group vitamins: Secondary | ICD-10-CM | POA: Diagnosis not present

## 2019-02-24 DIAGNOSIS — E785 Hyperlipidemia, unspecified: Secondary | ICD-10-CM | POA: Diagnosis not present

## 2019-02-28 DIAGNOSIS — E538 Deficiency of other specified B group vitamins: Secondary | ICD-10-CM | POA: Diagnosis not present

## 2019-03-31 DIAGNOSIS — E538 Deficiency of other specified B group vitamins: Secondary | ICD-10-CM | POA: Diagnosis not present

## 2019-05-02 DIAGNOSIS — E538 Deficiency of other specified B group vitamins: Secondary | ICD-10-CM | POA: Diagnosis not present

## 2019-05-26 DIAGNOSIS — Z79899 Other long term (current) drug therapy: Secondary | ICD-10-CM | POA: Diagnosis not present

## 2019-05-26 DIAGNOSIS — M5030 Other cervical disc degeneration, unspecified cervical region: Secondary | ICD-10-CM | POA: Diagnosis not present

## 2019-05-26 DIAGNOSIS — I10 Essential (primary) hypertension: Secondary | ICD-10-CM | POA: Diagnosis not present

## 2019-05-26 DIAGNOSIS — R0789 Other chest pain: Secondary | ICD-10-CM | POA: Diagnosis not present

## 2019-05-26 DIAGNOSIS — E785 Hyperlipidemia, unspecified: Secondary | ICD-10-CM | POA: Diagnosis not present

## 2019-05-26 DIAGNOSIS — E538 Deficiency of other specified B group vitamins: Secondary | ICD-10-CM | POA: Diagnosis not present

## 2019-05-26 DIAGNOSIS — R079 Chest pain, unspecified: Secondary | ICD-10-CM | POA: Diagnosis not present

## 2019-06-02 DIAGNOSIS — E538 Deficiency of other specified B group vitamins: Secondary | ICD-10-CM | POA: Diagnosis not present

## 2019-06-13 DIAGNOSIS — R0789 Other chest pain: Secondary | ICD-10-CM | POA: Diagnosis not present

## 2019-06-13 DIAGNOSIS — I10 Essential (primary) hypertension: Secondary | ICD-10-CM | POA: Diagnosis not present

## 2019-07-04 DIAGNOSIS — E538 Deficiency of other specified B group vitamins: Secondary | ICD-10-CM | POA: Diagnosis not present

## 2019-08-09 DIAGNOSIS — E538 Deficiency of other specified B group vitamins: Secondary | ICD-10-CM | POA: Diagnosis not present

## 2019-08-23 DIAGNOSIS — E538 Deficiency of other specified B group vitamins: Secondary | ICD-10-CM | POA: Diagnosis not present

## 2019-08-23 DIAGNOSIS — Z79899 Other long term (current) drug therapy: Secondary | ICD-10-CM | POA: Diagnosis not present

## 2019-08-23 DIAGNOSIS — E785 Hyperlipidemia, unspecified: Secondary | ICD-10-CM | POA: Diagnosis not present

## 2019-08-23 DIAGNOSIS — I10 Essential (primary) hypertension: Secondary | ICD-10-CM | POA: Diagnosis not present

## 2019-08-30 DIAGNOSIS — M24542 Contracture, left hand: Secondary | ICD-10-CM | POA: Diagnosis not present

## 2019-08-30 DIAGNOSIS — E538 Deficiency of other specified B group vitamins: Secondary | ICD-10-CM | POA: Diagnosis not present

## 2019-08-30 DIAGNOSIS — I10 Essential (primary) hypertension: Secondary | ICD-10-CM | POA: Diagnosis not present

## 2019-08-30 DIAGNOSIS — Z0000 Encounter for general adult medical examination without abnormal findings: Secondary | ICD-10-CM | POA: Diagnosis not present

## 2019-08-30 DIAGNOSIS — E785 Hyperlipidemia, unspecified: Secondary | ICD-10-CM | POA: Diagnosis not present

## 2019-09-05 DIAGNOSIS — M720 Palmar fascial fibromatosis [Dupuytren]: Secondary | ICD-10-CM | POA: Diagnosis not present

## 2019-09-13 DIAGNOSIS — E538 Deficiency of other specified B group vitamins: Secondary | ICD-10-CM | POA: Diagnosis not present

## 2019-10-18 DIAGNOSIS — E538 Deficiency of other specified B group vitamins: Secondary | ICD-10-CM | POA: Diagnosis not present

## 2019-11-02 DIAGNOSIS — M720 Palmar fascial fibromatosis [Dupuytren]: Secondary | ICD-10-CM | POA: Diagnosis not present

## 2019-11-18 DIAGNOSIS — E538 Deficiency of other specified B group vitamins: Secondary | ICD-10-CM | POA: Diagnosis not present

## 2019-11-30 DIAGNOSIS — I10 Essential (primary) hypertension: Secondary | ICD-10-CM | POA: Diagnosis not present

## 2019-11-30 DIAGNOSIS — R252 Cramp and spasm: Secondary | ICD-10-CM | POA: Diagnosis not present

## 2019-11-30 DIAGNOSIS — M5030 Other cervical disc degeneration, unspecified cervical region: Secondary | ICD-10-CM | POA: Diagnosis not present

## 2019-11-30 DIAGNOSIS — Z125 Encounter for screening for malignant neoplasm of prostate: Secondary | ICD-10-CM | POA: Diagnosis not present

## 2019-11-30 DIAGNOSIS — Z79899 Other long term (current) drug therapy: Secondary | ICD-10-CM | POA: Diagnosis not present

## 2019-11-30 DIAGNOSIS — E785 Hyperlipidemia, unspecified: Secondary | ICD-10-CM | POA: Diagnosis not present

## 2019-11-30 DIAGNOSIS — E538 Deficiency of other specified B group vitamins: Secondary | ICD-10-CM | POA: Diagnosis not present

## 2019-11-30 DIAGNOSIS — Z0000 Encounter for general adult medical examination without abnormal findings: Secondary | ICD-10-CM | POA: Diagnosis not present

## 2019-12-20 DIAGNOSIS — E538 Deficiency of other specified B group vitamins: Secondary | ICD-10-CM | POA: Diagnosis not present

## 2020-01-09 DIAGNOSIS — H2511 Age-related nuclear cataract, right eye: Secondary | ICD-10-CM | POA: Diagnosis not present

## 2020-01-20 DIAGNOSIS — E538 Deficiency of other specified B group vitamins: Secondary | ICD-10-CM | POA: Diagnosis not present

## 2020-01-24 DIAGNOSIS — L570 Actinic keratosis: Secondary | ICD-10-CM | POA: Diagnosis not present

## 2020-01-24 DIAGNOSIS — L821 Other seborrheic keratosis: Secondary | ICD-10-CM | POA: Diagnosis not present

## 2020-01-24 DIAGNOSIS — L814 Other melanin hyperpigmentation: Secondary | ICD-10-CM | POA: Diagnosis not present

## 2020-01-24 DIAGNOSIS — D229 Melanocytic nevi, unspecified: Secondary | ICD-10-CM | POA: Diagnosis not present

## 2020-02-21 DIAGNOSIS — I10 Essential (primary) hypertension: Secondary | ICD-10-CM | POA: Diagnosis not present

## 2020-02-21 DIAGNOSIS — Z125 Encounter for screening for malignant neoplasm of prostate: Secondary | ICD-10-CM | POA: Diagnosis not present

## 2020-02-21 DIAGNOSIS — E538 Deficiency of other specified B group vitamins: Secondary | ICD-10-CM | POA: Diagnosis not present

## 2020-02-21 DIAGNOSIS — Z79899 Other long term (current) drug therapy: Secondary | ICD-10-CM | POA: Diagnosis not present

## 2020-02-21 DIAGNOSIS — E785 Hyperlipidemia, unspecified: Secondary | ICD-10-CM | POA: Diagnosis not present

## 2020-02-21 DIAGNOSIS — R252 Cramp and spasm: Secondary | ICD-10-CM | POA: Diagnosis not present

## 2020-03-07 DIAGNOSIS — E538 Deficiency of other specified B group vitamins: Secondary | ICD-10-CM | POA: Diagnosis not present

## 2020-03-07 DIAGNOSIS — I10 Essential (primary) hypertension: Secondary | ICD-10-CM | POA: Diagnosis not present

## 2020-03-07 DIAGNOSIS — E785 Hyperlipidemia, unspecified: Secondary | ICD-10-CM | POA: Diagnosis not present

## 2020-03-12 DIAGNOSIS — L570 Actinic keratosis: Secondary | ICD-10-CM | POA: Diagnosis not present

## 2020-03-12 DIAGNOSIS — D492 Neoplasm of unspecified behavior of bone, soft tissue, and skin: Secondary | ICD-10-CM | POA: Diagnosis not present

## 2020-03-12 DIAGNOSIS — L858 Other specified epidermal thickening: Secondary | ICD-10-CM | POA: Diagnosis not present

## 2020-03-26 DIAGNOSIS — Z85828 Personal history of other malignant neoplasm of skin: Secondary | ICD-10-CM | POA: Diagnosis not present

## 2020-03-26 DIAGNOSIS — D492 Neoplasm of unspecified behavior of bone, soft tissue, and skin: Secondary | ICD-10-CM | POA: Diagnosis not present

## 2020-03-26 DIAGNOSIS — E538 Deficiency of other specified B group vitamins: Secondary | ICD-10-CM | POA: Diagnosis not present

## 2020-03-26 DIAGNOSIS — L858 Other specified epidermal thickening: Secondary | ICD-10-CM | POA: Diagnosis not present

## 2020-04-02 DIAGNOSIS — L858 Other specified epidermal thickening: Secondary | ICD-10-CM | POA: Diagnosis not present

## 2020-04-26 DIAGNOSIS — E538 Deficiency of other specified B group vitamins: Secondary | ICD-10-CM | POA: Diagnosis not present

## 2020-05-29 DIAGNOSIS — E538 Deficiency of other specified B group vitamins: Secondary | ICD-10-CM | POA: Diagnosis not present

## 2020-06-13 DIAGNOSIS — I10 Essential (primary) hypertension: Secondary | ICD-10-CM | POA: Diagnosis not present

## 2020-06-13 DIAGNOSIS — E538 Deficiency of other specified B group vitamins: Secondary | ICD-10-CM | POA: Diagnosis not present

## 2020-06-13 DIAGNOSIS — Z79899 Other long term (current) drug therapy: Secondary | ICD-10-CM | POA: Diagnosis not present

## 2020-06-13 DIAGNOSIS — M25552 Pain in left hip: Secondary | ICD-10-CM | POA: Diagnosis not present

## 2020-06-13 DIAGNOSIS — E785 Hyperlipidemia, unspecified: Secondary | ICD-10-CM | POA: Diagnosis not present

## 2020-06-29 DIAGNOSIS — E538 Deficiency of other specified B group vitamins: Secondary | ICD-10-CM | POA: Diagnosis not present

## 2020-07-18 DIAGNOSIS — J029 Acute pharyngitis, unspecified: Secondary | ICD-10-CM | POA: Diagnosis not present

## 2020-07-18 DIAGNOSIS — U071 COVID-19: Secondary | ICD-10-CM | POA: Diagnosis not present

## 2020-08-06 DIAGNOSIS — E538 Deficiency of other specified B group vitamins: Secondary | ICD-10-CM | POA: Diagnosis not present

## 2020-09-06 DIAGNOSIS — Z79899 Other long term (current) drug therapy: Secondary | ICD-10-CM | POA: Diagnosis not present

## 2020-09-06 DIAGNOSIS — E785 Hyperlipidemia, unspecified: Secondary | ICD-10-CM | POA: Diagnosis not present

## 2020-09-06 DIAGNOSIS — I10 Essential (primary) hypertension: Secondary | ICD-10-CM | POA: Diagnosis not present

## 2020-09-06 DIAGNOSIS — E538 Deficiency of other specified B group vitamins: Secondary | ICD-10-CM | POA: Diagnosis not present

## 2020-09-13 DIAGNOSIS — I10 Essential (primary) hypertension: Secondary | ICD-10-CM | POA: Diagnosis not present

## 2020-09-13 DIAGNOSIS — E538 Deficiency of other specified B group vitamins: Secondary | ICD-10-CM | POA: Diagnosis not present

## 2020-09-13 DIAGNOSIS — E7800 Pure hypercholesterolemia, unspecified: Secondary | ICD-10-CM | POA: Diagnosis not present

## 2020-10-08 DIAGNOSIS — E538 Deficiency of other specified B group vitamins: Secondary | ICD-10-CM | POA: Diagnosis not present

## 2020-11-08 DIAGNOSIS — E538 Deficiency of other specified B group vitamins: Secondary | ICD-10-CM | POA: Diagnosis not present

## 2020-12-11 DIAGNOSIS — E538 Deficiency of other specified B group vitamins: Secondary | ICD-10-CM | POA: Diagnosis not present

## 2021-01-09 DIAGNOSIS — H2511 Age-related nuclear cataract, right eye: Secondary | ICD-10-CM | POA: Diagnosis not present

## 2021-01-10 DIAGNOSIS — Z0000 Encounter for general adult medical examination without abnormal findings: Secondary | ICD-10-CM | POA: Diagnosis not present

## 2021-01-10 DIAGNOSIS — E538 Deficiency of other specified B group vitamins: Secondary | ICD-10-CM | POA: Diagnosis not present

## 2021-01-10 DIAGNOSIS — I10 Essential (primary) hypertension: Secondary | ICD-10-CM | POA: Diagnosis not present

## 2021-01-10 DIAGNOSIS — M5030 Other cervical disc degeneration, unspecified cervical region: Secondary | ICD-10-CM | POA: Diagnosis not present

## 2021-01-10 DIAGNOSIS — Z79899 Other long term (current) drug therapy: Secondary | ICD-10-CM | POA: Diagnosis not present

## 2021-01-10 DIAGNOSIS — R252 Cramp and spasm: Secondary | ICD-10-CM | POA: Diagnosis not present

## 2021-01-10 DIAGNOSIS — E782 Mixed hyperlipidemia: Secondary | ICD-10-CM | POA: Diagnosis not present

## 2021-02-01 DIAGNOSIS — E538 Deficiency of other specified B group vitamins: Secondary | ICD-10-CM | POA: Diagnosis not present

## 2021-03-09 ENCOUNTER — Encounter: Payer: Self-pay | Admitting: Internal Medicine

## 2021-03-09 ENCOUNTER — Inpatient Hospital Stay
Admission: EM | Admit: 2021-03-09 | Discharge: 2021-03-13 | DRG: 522 | Disposition: A | Discharge status: Skilled Nursing Facility | Payer: PPO | Attending: Internal Medicine | Admitting: Internal Medicine

## 2021-03-09 ENCOUNTER — Emergency Department: Payer: PPO

## 2021-03-09 DIAGNOSIS — F1010 Alcohol abuse, uncomplicated: Secondary | ICD-10-CM | POA: Diagnosis not present

## 2021-03-09 DIAGNOSIS — N400 Enlarged prostate without lower urinary tract symptoms: Secondary | ICD-10-CM | POA: Diagnosis not present

## 2021-03-09 DIAGNOSIS — Z471 Aftercare following joint replacement surgery: Secondary | ICD-10-CM | POA: Diagnosis not present

## 2021-03-09 DIAGNOSIS — S72001A Fracture of unspecified part of neck of right femur, initial encounter for closed fracture: Secondary | ICD-10-CM | POA: Diagnosis present

## 2021-03-09 DIAGNOSIS — Y92009 Unspecified place in unspecified non-institutional (private) residence as the place of occurrence of the external cause: Secondary | ICD-10-CM | POA: Diagnosis not present

## 2021-03-09 DIAGNOSIS — R52 Pain, unspecified: Secondary | ICD-10-CM | POA: Diagnosis not present

## 2021-03-09 DIAGNOSIS — M25551 Pain in right hip: Secondary | ICD-10-CM

## 2021-03-09 DIAGNOSIS — Z8249 Family history of ischemic heart disease and other diseases of the circulatory system: Secondary | ICD-10-CM | POA: Diagnosis not present

## 2021-03-09 DIAGNOSIS — Z87891 Personal history of nicotine dependence: Secondary | ICD-10-CM | POA: Diagnosis not present

## 2021-03-09 DIAGNOSIS — Z20822 Contact with and (suspected) exposure to covid-19: Secondary | ICD-10-CM | POA: Diagnosis present

## 2021-03-09 DIAGNOSIS — S72001D Fracture of unspecified part of neck of right femur, subsequent encounter for closed fracture with routine healing: Secondary | ICD-10-CM | POA: Diagnosis not present

## 2021-03-09 DIAGNOSIS — S72011A Unspecified intracapsular fracture of right femur, initial encounter for closed fracture: Principal | ICD-10-CM | POA: Diagnosis present

## 2021-03-09 DIAGNOSIS — Z79899 Other long term (current) drug therapy: Secondary | ICD-10-CM | POA: Diagnosis not present

## 2021-03-09 DIAGNOSIS — I10 Essential (primary) hypertension: Secondary | ICD-10-CM | POA: Diagnosis present

## 2021-03-09 DIAGNOSIS — W19XXXA Unspecified fall, initial encounter: Secondary | ICD-10-CM

## 2021-03-09 DIAGNOSIS — R531 Weakness: Secondary | ICD-10-CM | POA: Diagnosis not present

## 2021-03-09 DIAGNOSIS — E785 Hyperlipidemia, unspecified: Secondary | ICD-10-CM | POA: Diagnosis present

## 2021-03-09 DIAGNOSIS — S72031A Displaced midcervical fracture of right femur, initial encounter for closed fracture: Secondary | ICD-10-CM | POA: Diagnosis not present

## 2021-03-09 DIAGNOSIS — I5032 Chronic diastolic (congestive) heart failure: Secondary | ICD-10-CM | POA: Diagnosis present

## 2021-03-09 DIAGNOSIS — Z789 Other specified health status: Secondary | ICD-10-CM

## 2021-03-09 DIAGNOSIS — G8929 Other chronic pain: Secondary | ICD-10-CM | POA: Diagnosis not present

## 2021-03-09 DIAGNOSIS — Z7982 Long term (current) use of aspirin: Secondary | ICD-10-CM

## 2021-03-09 DIAGNOSIS — I110 Hypertensive heart disease with heart failure: Secondary | ICD-10-CM | POA: Diagnosis present

## 2021-03-09 DIAGNOSIS — Z736 Limitation of activities due to disability: Secondary | ICD-10-CM | POA: Diagnosis not present

## 2021-03-09 DIAGNOSIS — D696 Thrombocytopenia, unspecified: Secondary | ICD-10-CM | POA: Diagnosis not present

## 2021-03-09 DIAGNOSIS — Z7289 Other problems related to lifestyle: Secondary | ICD-10-CM

## 2021-03-09 DIAGNOSIS — E569 Vitamin deficiency, unspecified: Secondary | ICD-10-CM | POA: Diagnosis not present

## 2021-03-09 DIAGNOSIS — M5030 Other cervical disc degeneration, unspecified cervical region: Secondary | ICD-10-CM | POA: Diagnosis not present

## 2021-03-09 DIAGNOSIS — Z9889 Other specified postprocedural states: Secondary | ICD-10-CM | POA: Diagnosis not present

## 2021-03-09 DIAGNOSIS — E440 Moderate protein-calorie malnutrition: Secondary | ICD-10-CM | POA: Diagnosis not present

## 2021-03-09 DIAGNOSIS — Z96641 Presence of right artificial hip joint: Secondary | ICD-10-CM | POA: Diagnosis not present

## 2021-03-09 DIAGNOSIS — Z85828 Personal history of other malignant neoplasm of skin: Secondary | ICD-10-CM

## 2021-03-09 DIAGNOSIS — Z96649 Presence of unspecified artificial hip joint: Secondary | ICD-10-CM

## 2021-03-09 DIAGNOSIS — Z7401 Bed confinement status: Secondary | ICD-10-CM | POA: Diagnosis not present

## 2021-03-09 DIAGNOSIS — W11XXXA Fall on and from ladder, initial encounter: Secondary | ICD-10-CM | POA: Diagnosis present

## 2021-03-09 DIAGNOSIS — M6281 Muscle weakness (generalized): Secondary | ICD-10-CM | POA: Diagnosis not present

## 2021-03-09 DIAGNOSIS — I700 Atherosclerosis of aorta: Secondary | ICD-10-CM | POA: Diagnosis not present

## 2021-03-09 DIAGNOSIS — R5381 Other malaise: Secondary | ICD-10-CM | POA: Diagnosis not present

## 2021-03-09 DIAGNOSIS — S79911A Unspecified injury of right hip, initial encounter: Secondary | ICD-10-CM | POA: Diagnosis not present

## 2021-03-09 LAB — BASIC METABOLIC PANEL
Anion gap: 9 (ref 5–15)
BUN: 19 mg/dL (ref 8–23)
CO2: 28 mmol/L (ref 22–32)
Calcium: 9.1 mg/dL (ref 8.9–10.3)
Chloride: 102 mmol/L (ref 98–111)
Creatinine, Ser: 1.03 mg/dL (ref 0.61–1.24)
GFR, Estimated: >60 mL/min (ref >60)
Glucose, Bld: 108 mg/dL — ABNORMAL HIGH (ref 70–99)
Potassium: 3.7 mmol/L (ref 3.5–5.1)
Sodium: 139 mmol/L (ref 135–145)

## 2021-03-09 LAB — PROTIME-INR
INR: 1 (ref 0.8–1.2)
Prothrombin Time: 13.3 seconds (ref 11.4–15.2)

## 2021-03-09 LAB — CBC WITH DIFFERENTIAL/PLATELET
Abs Immature Granulocytes: 0.04 10*3/uL (ref 0.00–0.07)
Basophils Absolute: 0 10*3/uL (ref 0.0–0.1)
Basophils Relative: 0 %
Eosinophils Absolute: 0.1 10*3/uL (ref 0.0–0.5)
Eosinophils Relative: 1 %
HCT: 38.5 % — ABNORMAL LOW (ref 39.0–52.0)
Hemoglobin: 13.6 g/dL (ref 13.0–17.0)
Immature Granulocytes: 1 %
Lymphocytes Relative: 24 %
Lymphs Abs: 1.3 10*3/uL (ref 0.7–4.0)
MCH: 32.6 pg (ref 26.0–34.0)
MCHC: 35.3 g/dL (ref 30.0–36.0)
MCV: 92.3 fL (ref 80.0–100.0)
Monocytes Absolute: 0.5 10*3/uL (ref 0.1–1.0)
Monocytes Relative: 9 %
Neutro Abs: 3.5 10*3/uL (ref 1.7–7.7)
Neutrophils Relative %: 65 %
Platelets: 167 10*3/uL (ref 150–400)
RBC: 4.17 MIL/uL — ABNORMAL LOW (ref 4.22–5.81)
RDW: 12.2 % (ref 11.5–15.5)
WBC: 5.4 10*3/uL (ref 4.0–10.5)
nRBC: 0 % (ref 0.0–0.2)

## 2021-03-09 LAB — APTT: aPTT: 29 seconds (ref 24–36)

## 2021-03-09 LAB — RESP PANEL BY RT-PCR (FLU A&B, COVID) ARPGX2
Influenza A by PCR: NEGATIVE
Influenza B by PCR: NEGATIVE
SARS Coronavirus 2 by RT PCR: NEGATIVE

## 2021-03-09 LAB — TYPE AND SCREEN
ABO/RH(D): O POS
Antibody Screen: NEGATIVE

## 2021-03-09 LAB — BRAIN NATRIURETIC PEPTIDE: B Natriuretic Peptide: 45.8 pg/mL (ref 0.0–100.0)

## 2021-03-09 MED ORDER — CLINDAMYCIN PHOSPHATE 600 MG/50ML IV SOLN
600.0000 mg | INTRAVENOUS | Status: AC
  Administered 2021-03-10: 600 mg via INTRAVENOUS
  Filled 2021-03-09: qty 50

## 2021-03-09 MED ORDER — LORAZEPAM 2 MG PO TABS
0.0000 mg | ORAL_TABLET | Freq: Four times a day (QID) | ORAL | Status: DC
  Administered 2021-03-10: 2 mg via ORAL
  Administered 2021-03-10: 1 mg via ORAL
  Administered 2021-03-11: 2 mg via ORAL
  Filled 2021-03-09 (×3): qty 1

## 2021-03-09 MED ORDER — FOLIC ACID 1 MG PO TABS
1.0000 mg | ORAL_TABLET | Freq: Every day | ORAL | Status: DC
  Administered 2021-03-09 – 2021-03-13 (×4): 1 mg via ORAL
  Filled 2021-03-09 (×4): qty 1

## 2021-03-09 MED ORDER — LORAZEPAM 2 MG PO TABS: 0.0000 mg | ORAL_TABLET | Freq: Two times a day (BID) | ORAL | Status: DC

## 2021-03-09 MED ORDER — LORAZEPAM 2 MG/ML IJ SOLN: 1.0000 mg | INTRAMUSCULAR | Status: DC | PRN

## 2021-03-09 MED ORDER — HYDRALAZINE HCL 20 MG/ML IJ SOLN: 5.0000 mg | INTRAMUSCULAR | Status: DC | PRN

## 2021-03-09 MED ORDER — THIAMINE HCL 100 MG PO TABS
100.0000 mg | ORAL_TABLET | Freq: Every day | ORAL | Status: DC
  Administered 2021-03-09 – 2021-03-13 (×4): 100 mg via ORAL
  Filled 2021-03-09 (×4): qty 1

## 2021-03-09 MED ORDER — ADULT MULTIVITAMIN W/MINERALS CH
1.0000 | ORAL_TABLET | Freq: Every day | ORAL | Status: DC
  Administered 2021-03-09 – 2021-03-13 (×4): 1 via ORAL
  Filled 2021-03-09 (×4): qty 1

## 2021-03-09 MED ORDER — THIAMINE HCL 100 MG/ML IJ SOLN: 100.0000 mg | Freq: Every day | INTRAMUSCULAR | Status: DC

## 2021-03-09 MED ORDER — CHLORHEXIDINE GLUCONATE 4 % EX LIQD: 1.0000 "application " | Freq: Once | CUTANEOUS | Status: DC

## 2021-03-09 MED ORDER — METHOCARBAMOL 500 MG PO TABS
500.0000 mg | ORAL_TABLET | Freq: Three times a day (TID) | ORAL | Status: DC | PRN
  Administered 2021-03-09: 500 mg via ORAL
  Filled 2021-03-09 (×2): qty 1

## 2021-03-09 MED ORDER — CEFAZOLIN SODIUM-DEXTROSE 1-4 GM/50ML-% IV SOLN
1.0000 g | INTRAVENOUS | Status: AC
  Administered 2021-03-10: 1 g via INTRAVENOUS
  Filled 2021-03-09: qty 50

## 2021-03-09 MED ORDER — ACETAMINOPHEN 325 MG PO TABS: 650.0000 mg | ORAL_TABLET | Freq: Four times a day (QID) | ORAL | Status: DC | PRN

## 2021-03-09 MED ORDER — ASPIRIN 81 MG PO CHEW
81.0000 mg | CHEWABLE_TABLET | Freq: Every day | ORAL | Status: DC
  Administered 2021-03-11 – 2021-03-13 (×3): 81 mg via ORAL
  Filled 2021-03-09 (×3): qty 1

## 2021-03-09 MED ORDER — GABAPENTIN 300 MG PO CAPS
300.0000 mg | ORAL_CAPSULE | Freq: Every day | ORAL | Status: DC
  Administered 2021-03-10 – 2021-03-12 (×4): 300 mg via ORAL
  Filled 2021-03-09 (×4): qty 1

## 2021-03-09 MED ORDER — HYDROMORPHONE HCL 1 MG/ML IJ SOLN
0.5000 mg | Freq: Once | INTRAMUSCULAR | Status: AC
Start: 2021-03-09 — End: 2021-03-09
  Administered 2021-03-09: 0.5 mg via INTRAVENOUS
  Filled 2021-03-09: qty 1

## 2021-03-09 MED ORDER — ONDANSETRON HCL 4 MG/2ML IJ SOLN: 4.0000 mg | Freq: Three times a day (TID) | INTRAMUSCULAR | Status: DC | PRN

## 2021-03-09 MED ORDER — BIOTIN 10 MG PO CAPS: 10000.0000 ug | ORAL_CAPSULE | Freq: Every day | ORAL | Status: DC

## 2021-03-09 MED ORDER — MORPHINE SULFATE (PF) 4 MG/ML IV SOLN
4.0000 mg | Freq: Once | INTRAVENOUS | Status: AC
Start: 2021-03-09 — End: 2021-03-09
  Administered 2021-03-09: 4 mg via INTRAVENOUS
  Filled 2021-03-09: qty 1

## 2021-03-09 MED ORDER — LORAZEPAM 1 MG PO TABS: 1.0000 mg | ORAL_TABLET | ORAL | Status: DC | PRN

## 2021-03-09 MED ORDER — HYDROCODONE-ACETAMINOPHEN 5-325 MG PO TABS
1.0000 | ORAL_TABLET | ORAL | Status: DC | PRN
  Administered 2021-03-09 – 2021-03-10 (×3): 1 via ORAL
  Filled 2021-03-09 (×3): qty 1

## 2021-03-09 MED ORDER — SENNOSIDES-DOCUSATE SODIUM 8.6-50 MG PO TABS
1.0000 | ORAL_TABLET | Freq: Every evening | ORAL | Status: DC | PRN
  Administered 2021-03-12: 1 via ORAL
  Filled 2021-03-09: qty 1

## 2021-03-09 MED ORDER — MORPHINE SULFATE (PF) 2 MG/ML IV SOLN
1.0000 mg | INTRAVENOUS | Status: DC | PRN
Start: 2021-03-09 — End: 2021-03-09

## 2021-03-09 MED ORDER — MORPHINE SULFATE (PF) 2 MG/ML IV SOLN
1.0000 mg | INTRAVENOUS | Status: DC | PRN
Start: 2021-03-09 — End: 2021-03-11
  Administered 2021-03-09 – 2021-03-10 (×3): 1 mg via INTRAVENOUS
  Filled 2021-03-09 (×3): qty 1

## 2021-03-09 MED ORDER — SODIUM CHLORIDE 0.9 % IV SOLN: INTRAVENOUS | Status: DC

## 2021-03-09 NOTE — ED Notes (Signed)
Wife at bedside with [employer].

## 2021-03-09 NOTE — ED Provider Notes (Signed)
[facility] Emergency Department Provider Note ____________________________________________   Event Date/Time   First MD Initiated Contact with Patient 03/09/21 1202     (approximate)  I have reviewed the triage vital signs and the nursing notes.  HISTORY  Chief Complaint Hip Pain   HPI Alan Brock is a 75 y.o. malewho presents to the ED for evaluation of right hip pain after a fall.   Chart review indicates hx HTN, HLD. ASA81 is only thinner.   Pt presents to the ED from home via EMS after an accidental fall from ladder. Reports he was just 5 feet up when the feet of the ladder shifted, and he fell to the ground, directly onto the posterior portion of his right hip. Denies head injury or syncope. Reports minor abrasion to his right elbow, but no further trauma.   No recent illnesses.   Past Medical History:  Diagnosis Date   Adenomatous polyps    BPH (benign prostatic hyperplasia)    Cancer (HCC)    SKIN   DDD (degenerative disc disease), cervical    Elevated PSA    Hyperlipidemia    Hypertension     There are no problems to display for this patient.   Past Surgical History:  Procedure Laterality Date   ADENOIDECTOMY     APPENDECTOMY     CATARACT EXTRACTION W/PHACO Left 02/02/2018   Procedure: CATARACT EXTRACTION PHACO AND INTRAOCULAR LENS PLACEMENT (IOC);  Surgeon: Birder Robson, MD;  Location: [facility] ORS;  Service: Ophthalmology;  Laterality: Left;  [place] 00:35.6 AP% 14.4 CDE 5.12 Fluid pack lot # [id number]   COLONOSCOPY     COLONOSCOPY WITH PROPOFOL N/A 12/03/2016   Procedure: COLONOSCOPY WITH PROPOFOL;  Surgeon: Manya Silvas, MD;  Location: [facility] ENDOSCOPY;  Service: Endoscopy;  Laterality: N/A;   ESOPHAGOGASTRODUODENOSCOPY     FLEXIBLE SIGMOIDOSCOPY      Prior to Admission medications   Medication Sig Start Date End Date Taking? Authorizing Provider  acetaminophen (TYLENOL) 325 MG tablet Take 650 mg by mouth every 6 (six)  hours as needed.    [provider]  aspirin 81 MG chewable tablet Chew 81 mg by mouth daily.    [provider]  Cyanocobalamin (VITAMIN B-12 IJ) Inject as directed every 30 (thirty) days.    [provider]    Allergies Patient has no known allergies.  No family history on file.  Social History Social History   Tobacco Use   Smoking status: Former   Smokeless tobacco: Current  Vaping Use   Vaping Use: Never used  Substance Use Topics   Alcohol use: Yes    Comment: 4 daily   Drug use: No    Types: Other-see comments    Comment: unknown    Review of Systems  Constitutional: No fever/chills Eyes: No visual changes. ENT: No sore throat. Cardiovascular: Denies chest pain. Respiratory: Denies shortness of breath. Gastrointestinal: No abdominal pain.  No nausea, no vomiting.  No diarrhea.  No constipation. Genitourinary: Negative for dysuria. Musculoskeletal: Negative for back pain. Right hip pain after a fall.  Skin: Negative for rash. Neurological: Negative for headaches, focal weakness or numbness.   ____________________________________________   PHYSICAL EXAM:  VITAL SIGNS: Vitals:   03/09/21 1206 03/09/21 1210  BP:    Pulse:  71  Resp:  15  Temp: 98.2 F (36.8 C)   SpO2:  97%      Constitutional: Alert and oriented. Well appearing and in no acute distress. Eyes:  Conjunctivae are normal. PERRL. EOMI. Head: Atraumatic. Nose: No congestion/rhinnorhea. Mouth/Throat: Mucous membranes are moist.  Oropharynx non-erythematous. Neck: No stridor. No cervical spine tenderness to palpation. Cardiovascular: Normal rate, regular rhythm. Grossly normal heart sounds.  Good peripheral circulation. Respiratory: Normal respiratory effort.  No retractions. Lungs CTAB. Gastrointestinal: Soft , nondistended, nontender to palpation. No CVA tenderness. Musculoskeletal:  Right leg is shortened and externally rotated.  Sensation intact throughout,  DP pulses strong and symmetrical to the left.  Tender over the right proximal femur and hip.  No laceration or open injury. Superficial abrasion to the posterior aspect of the proximal right forearm, no laceration or evidence of open injury.  Full range of motion of the elbow without pain. Neurologic:  Normal speech and language. No gross focal neurologic deficits are appreciated.  Skin:  Skin is warm, dry and intact. No rash noted. Psychiatric: Mood and affect are normal. Speech and behavior are normal.  ____________________________________________   LABS (all labs ordered are listed, but only abnormal results are displayed)  Labs Reviewed  CBC WITH DIFFERENTIAL/PLATELET - Abnormal; Notable for the following components:      Result Value   RBC 4.17 (*)    HCT 38.5 (*)    All other components within normal limits  BASIC METABOLIC PANEL - Abnormal; Notable for the following components:   Glucose, Bld 108 (*)    All other components within normal limits  RESP PANEL BY RT-PCR (FLU A&B, COVID) [id number]  PROTIME-INR  TYPE AND SCREEN   ____________________________________________  12 Lead EKG   ____________________________________________  RADIOLOGY  ED MD interpretation: Plain film of the chest reviewed by me without evidence of acute cardiopulmonary pathology. Plain film of the pelvis and right hip reviewed by me with displaced right femoral neck fracture  Official radiology report(s): DG Chest Portable 1 View  Result Date: 03/09/2021 CLINICAL DATA:  Right hip fracture. EXAM: PORTABLE CHEST 1 VIEW COMPARISON:  None. FINDINGS: The heart size and mediastinal contours are within normal limits. Both lungs are clear. The visualized skeletal structures are unremarkable. IMPRESSION: No active disease. Aortic Atherosclerosis (ICD10-I70.0). Electronically Signed   By: Marijo Conception M.D.   On: 03/09/2021 12:42   DG Hip Unilat W or Wo Pelvis 2-3 Views Right  Result Date:  03/09/2021 CLINICAL DATA:  Right hip pain after fall. EXAM: DG HIP (WITH OR WITHOUT PELVIS) 2-3V RIGHT COMPARISON:  None. FINDINGS: Moderately displaced proximal right femoral neck fracture is noted. IMPRESSION: Moderately displaced proximal right femoral neck fracture. Electronically Signed   By: Marijo Conception M.D.   On: 03/09/2021 12:41    ____________________________________________   PROCEDURES and INTERVENTIONS  Procedure(s) performed (including Critical Care):  .1-3 Lead EKG Interpretation  Date/Time: 03/09/2021 1:13 PM Performed by: Vladimir Crofts, MD Authorized by: Vladimir Crofts, MD     Interpretation: normal     ECG rate:  70   ECG rate assessment: normal     Rhythm: sinus rhythm     Ectopy: none     Conduction: normal    Medications  HYDROmorphone (DILAUDID) injection 0.5 mg (has no administration in time range)  0.9 %  sodium chloride infusion (has no administration in time range)  ceFAZolin (ANCEF) IVPB 1 g/50 mL premix (has no administration in time range)  clindamycin (CLEOCIN) IVPB 600 mg (has no administration in time range)  chlorhexidine (HIBICLENS) 4 % liquid 1 application (has no administration in time range)  HYDROcodone-acetaminophen (NORCO/VICODIN) 5-325 MG per tablet 1 tablet (has no administration  in time range)  morphine 2 MG/ML injection 1 mg (has no administration in time range)  morphine 4 MG/ML injection 4 mg (4 mg Intravenous Given 03/09/21 1212)    ____________________________________________   MDM / ED COURSE   75 year old male presents to the ED with a closed right hip fracture after mechanical fall.  Aspirin 81 without proper anticoagulation.  No indications for head imaging.  Plain films confirm right hip fracture.  Work-up is otherwise benign.  He is neurovascularly intact and his pain is well controlled.  Dr. Sabra Heck with orthopedics anticipates hemiarthroplasty performed tomorrow.  We will admit to medicine for further work-up and  management.  Clinical Course as of 03/09/21 1314  Sat Mar 09, 2021  1206 Discussed the patient my suspicion for hip fracture.  We discussed work-up and he is in agreement. [DS]    Clinical Course User Index [DS] Vladimir Crofts, MD    ____________________________________________   FINAL CLINICAL IMPRESSION(S) / ED DIAGNOSES  Final diagnoses:  Closed right hip fracture, initial encounter [facility])  Right hip pain  Fall, initial encounter     ED Discharge Orders     None         Tamala Julian   Note:  This document was prepared using Dragon voice recognition software and may include unintentional dictation errors.    Vladimir Crofts, MD 03/09/21 [phone]

## 2021-03-09 NOTE — ED Notes (Signed)
Called for transport

## 2021-03-09 NOTE — ED Notes (Signed)
1A (ortho floor) called for assistance to set up [employer] traction as ordered for pt

## 2021-03-09 NOTE — ED Notes (Signed)
Patient's wife has left the bedside. Patient is watching TV, waiting for transport.

## 2021-03-09 NOTE — ED Triage Notes (Signed)
Pt in from home after fall off ladder, arrives via [facility]. C/o R hip pain after falling 80f off ladder, landed on R side. Outward rotation and shortening present on arrival, unable to bear weight on RLE. No other injuries noted. VSS, no thinners and no LOC

## 2021-03-09 NOTE — Consult Note (Signed)
ORTHOPAEDIC CONSULTATION  REQUESTING PHYSICIAN: Ivor Costa, MD  Chief Complaint: Right hip pain  HPI: Alan Brock is a 75 y.o. male who complains of right hip pain after jumping off a ladder that was falling today.  He was cutting bushes in his yard and ladder gave way.  He fell and landed on his right hip.  He was brought to the emergency room where exam and x-rays reveal a displaced subcapital fracture of the right hip.  Risks and benefits of the different of surgery and surgery were discussed with him.  I have recommended a hemiarthroplasty to replace the displaced femoral head and he is agreeable to this.  We plan on doing this in the morning.  His health is generally good.  Dr. Doy Hutching is his primary care doctor.  Past Medical History:  Diagnosis Date   Adenomatous polyps    BPH (benign prostatic hyperplasia)    Cancer (HCC)    SKIN   DDD (degenerative disc disease), cervical    Elevated PSA    Hyperlipidemia    Hypertension    Past Surgical History:  Procedure Laterality Date   ADENOIDECTOMY     APPENDECTOMY     CATARACT EXTRACTION W/PHACO Left 02/02/2018   Procedure: CATARACT EXTRACTION PHACO AND INTRAOCULAR LENS PLACEMENT ([place]);  Surgeon: Birder Robson, MD;  Location: [facility] ORS;  Service: Ophthalmology;  Laterality: Left;  [place] 00:35.6 AP% 14.4 CDE 5.12 Fluid pack lot # [id number]   COLONOSCOPY     COLONOSCOPY WITH PROPOFOL N/A 12/03/2016   Procedure: COLONOSCOPY WITH PROPOFOL;  Surgeon: Manya Silvas, MD;  Location: [facility] ENDOSCOPY;  Service: Endoscopy;  Laterality: N/A;   ESOPHAGOGASTRODUODENOSCOPY     FLEXIBLE SIGMOIDOSCOPY     Social History   Socioeconomic History   Marital status: Married    Spouse name: Not on file   Number of children: Not on file   Years of education: Not on file   Highest education level: Not on file  Occupational History   Not on file  Tobacco Use   Smoking status: Former   Smokeless tobacco: Current  Vaping Use   Vaping Use:  Never used  Substance and Sexual Activity   Alcohol use: Yes    Comment: 4 daily   Drug use: No    Types: Other-see comments    Comment: unknown   Sexual activity: Not on file  Other Topics Concern   Not on file  Social History Narrative   Not on file   Social Determinants of Health   Financial Resource Strain: Not on file  Food Insecurity: Not on file  Transportation Needs: Not on file  Physical Activity: Not on file  Stress: Not on file  Social Connections: Not on file   Family History  Problem Relation Age of Onset   Heart attack Mother    Heart attack Father    No Known Allergies Prior to Admission medications   Medication Sig Start Date End Date Taking? Authorizing Provider  aspirin 81 MG chewable tablet Chew 81 mg by mouth daily.   Yes [provider]  Biotin 10 MG CAPS Take 10,000 mcg by mouth daily at 6 (six) AM.   Yes [provider]  gabapentin (NEURONTIN) 300 MG capsule Take 300 mg by mouth at bedtime. 01/10/21  Yes [provider]  acetaminophen (TYLENOL) 325 MG tablet Take 650 mg by mouth every 6 (six) hours as needed.    [provider]  Cyanocobalamin (VITAMIN B-12 IJ) Inject as  directed every 30 (thirty) days. Patient not taking: Reported on 03/09/2021    [provider]  tadalafil (CIALIS) 20 MG tablet Take 20 mg by mouth every 3 (three) days. 09/17/20   [provider]   DG Chest Portable 1 View  Result Date: 03/09/2021 CLINICAL DATA:  Right hip fracture. EXAM: PORTABLE CHEST 1 VIEW COMPARISON:  None. FINDINGS: The heart size and mediastinal contours are within normal limits. Both lungs are clear. The visualized skeletal structures are unremarkable. IMPRESSION: No active disease. Aortic Atherosclerosis (ICD10-I70.0). Electronically Signed   By: Marijo Conception M.D.   On: 03/09/2021 12:42   DG Hip Unilat W or Wo Pelvis 2-3 Views Right  Result Date: 03/09/2021 CLINICAL DATA:  Right hip pain after fall. EXAM:  DG HIP (WITH OR WITHOUT PELVIS) 2-3V RIGHT COMPARISON:  None. FINDINGS: Moderately displaced proximal right femoral neck fracture is noted. IMPRESSION: Moderately displaced proximal right femoral neck fracture. Electronically Signed   By: Marijo Conception M.D.   On: 03/09/2021 12:41    Positive ROS: All other systems have been reviewed and were otherwise negative with the exception of those mentioned in the HPI and as above.  Physical Exam: General: Alert, no acute distress Cardiovascular: No pedal edema Respiratory: No cyanosis, no use of accessory musculature GI: No organomegaly, abdomen is soft and non-tender Skin: No lesions in the area of chief complaint Neurologic: Sensation intact distally Psychiatric: Patient is competent for consent with normal mood and affect Lymphatic: No axillary or cervical lymphadenopathy  MUSCULOSKELETAL: The patient is alert and cooperative.  Is right leg is shortened and rotated.  There is pain with movement.  The skin is intact.  Neurovascular status is good distally.  The left lower extremity is normal.  Upper extremities and spine are normal.  Distal  Assessment: Subcapital fracture right hip  Plan: Right hip hemiarthroplasty tomorrow morning    Park Breed, MD [phone]   03/09/2021 2:31 PM

## 2021-03-09 NOTE — ED Notes (Signed)
Patient is eating his [employer], remains calm and cooperative. Patient used urinal.

## 2021-03-09 NOTE — H&P (Signed)
History and Physical    Alan Brock [id number] DOB: 1946/02/01 DOA: 03/09/2021  Referring MD/NP/PA:   PCP: Idelle Crouch, MD   Patient coming from:  The patient is coming from home.  At baseline, pt is independent for most of ADL.        Chief Complaint: fall and right hip pain  HPI: Alan Brock is a 75 y.o. male with medical history significant of hypertension, hyperlipidemia, BPH, skin cancer, C-spine degenerative disc disease, dCHF, who presents with a fall and right hip pain.  Patient states that she accidentally fall off the ladder about 5 feet high at 11:30 AM.  No loss of consciousness.  Patient is 100% sure that he did not injure his head or neck.  Patient does not headache.  Patient has chronic neck pain which has not changed.  Patient has developed severe pain in the right hip, which is sharp, constant, severe, 10 out of 10 in severity, nonradiating. The right leg is shortened and externally rotated.  No nausea vomiting, diarrhea or abdominal pain.  Patient denies any chest pain, cough, shortness of breath.  No fever or chills.  No symptoms of UTI.  Patient states that she drinks 2 beers every day.  ED Course: pt was found to have WBC 5.4, negative COVID PCR, electrolytes renal function okay, temperature normal, blood pressure 142/65, heart rate 71, RR 15, oxygen saturation 97% on room air.  X-ray showed displaced right femoral neck fracture.  Patient is admitted to [facility] bed as inpatient.  Dr. Sabra Heck of Ortho is consulted, planning to do surgery tomorrow morning.  Review of Systems:   General: no fevers, chills, no body weight gain, has fatigue HEENT: no blurry vision, hearing changes or sore throat Respiratory: no dyspnea, coughing, wheezing CV: no chest pain, no palpitations GI: no nausea, vomiting, abdominal pain, diarrhea, constipation GU: no dysuria, burning on urination, increased urinary frequency, hematuria  Ext: no leg edema Neuro: no unilateral  weakness, numbness, or tingling, no vision change or hearing loss. Has fall. Skin: no rash, no skin tear. MSK: has right hip pain. Heme: No easy bruising.  Travel history: No recent long distant travel.  Allergy: No Known Allergies  Past Medical History:  Diagnosis Date   Adenomatous polyps    BPH (benign prostatic hyperplasia)    Cancer (HCC)    SKIN   DDD (degenerative disc disease), cervical    Elevated PSA    Hyperlipidemia    Hypertension     Past Surgical History:  Procedure Laterality Date   ADENOIDECTOMY     APPENDECTOMY     CATARACT EXTRACTION W/PHACO Left 02/02/2018   Procedure: CATARACT EXTRACTION PHACO AND INTRAOCULAR LENS PLACEMENT ([place]);  Surgeon: Birder Robson, MD;  Location: [facility] ORS;  Service: Ophthalmology;  Laterality: Left;  [place] 00:35.6 AP% 14.4 CDE 5.12 Fluid pack lot # [id number]   COLONOSCOPY     COLONOSCOPY WITH PROPOFOL N/A 12/03/2016   Procedure: COLONOSCOPY WITH PROPOFOL;  Surgeon: Manya Silvas, MD;  [facility]: [facility] ENDOSCOPY;  Service: Endoscopy;  Laterality: N/A;   ESOPHAGOGASTRODUODENOSCOPY     FLEXIBLE SIGMOIDOSCOPY      Social History:  reports that he has quit smoking. He uses smokeless tobacco. He reports current alcohol use. He reports that he does not use drugs.  Family History:  Family History  Problem Relation Age of Onset   Heart attack Mother    Heart attack Father      Prior to Admission medications  Medication Sig Start Date End Date Taking? Authorizing Provider  acetaminophen (TYLENOL) 325 MG tablet Take 650 mg by mouth every 6 (six) hours as needed.    [provider]  aspirin 81 MG chewable tablet Chew 81 mg by mouth daily.    [provider]  Cyanocobalamin (VITAMIN B-12 IJ) Inject as directed every 30 (thirty) days.    [provider]    Physical Exam: Vitals:   03/09/21 1204 03/09/21 1206 03/09/21 1210 03/09/21 1300  BP: (!) 142/65     Pulse: 78  71 64  Resp:   15   Temp:  98.2 F  (36.8 C)    TempSrc:  Oral    SpO2: 97%  97% 100%   General: Not in acute distress HEENT:       Eyes: PERRL, EOMI, no scleral icterus.       ENT: No discharge from the ears and nose, no pharynx injection, no tonsillar enlargement.        Neck: No JVD, no bruit, no mass felt. Heme: No neck lymph node enlargement. Cardiac: S1/S2, RRR, No murmurs, No gallops or rubs. Respiratory: No rales, wheezing, rhonchi or rubs. GI: Soft, nondistended, nontender, no rebound pain, no organomegaly, BS present. GU: No hematuria Ext: No pitting leg edema bilaterally. 1+DP/PT pulse bilaterally. Musculoskeletal: Has tenderness in the right hip, right leg is shortened and externally rotated. Skin: No rashes.  Neuro: Alert, oriented X3, cranial nerves II-XII grossly intact, moves all extremities normally.  Psych: Patient is not psychotic, no suicidal or hemocidal ideation.  Labs on Admission: I have personally reviewed following labs and imaging studies  CBC: Recent Labs  Lab 03/09/21 1208  WBC 5.4  NEUTROABS 3.5  HGB 13.6  HCT 38.5*  MCV 92.3  PLT [id number]   Basic Metabolic Panel: Recent Labs  Lab 03/09/21 1208  NA 139  K 3.7  CL 102  CO2 28  GLUCOSE 108*  BUN 19  CREATININE 1.03  CALCIUM 9.1   GFR: CrCl cannot be calculated (Unknown ideal weight.). Liver Function Tests: No results for input(s): AST, ALT, ALKPHOS, BILITOT, PROT, ALBUMIN in the last 168 hours. No results for input(s): LIPASE, AMYLASE in the last 168 hours. No results for input(s): AMMONIA in the last 168 hours. Coagulation Profile: Recent Labs  Lab 03/09/21 1208  INR 1.0   Cardiac Enzymes: No results for input(s): CKTOTAL, CKMB, CKMBINDEX, TROPONINI in the last 168 hours. BNP (last 3 results) No results for input(s): PROBNP in the last 8760 hours. HbA1C: No results for input(s): HGBA1C in the last 72 hours. CBG: No results for input(s): GLUCAP in the last 168 hours. Lipid Profile: No results for input(s): CHOL,  HDL, LDLCALC, TRIG, CHOLHDL, LDLDIRECT in the last 72 hours. Thyroid Function Tests: No results for input(s): TSH, T4TOTAL, FREET4, T3FREE, THYROIDAB in the last 72 hours. Anemia Panel: No results for input(s): VITAMINB12, FOLATE, FERRITIN, TIBC, IRON, RETICCTPCT in the last 72 hours. Urine analysis: No results found for: COLORURINE, APPEARANCEUR, LABSPEC, PHURINE, GLUCOSEU, HGBUR, BILIRUBINUR, KETONESUR, PROTEINUR, UROBILINOGEN, NITRITE, LEUKOCYTESUR Sepsis Labs: '@LABRCNTIP'$ (procalcitonin:4,lacticidven:4) ) Recent Results (from the past 240 hour(s))  Resp Panel by RT-PCR (Flu A&B, Covid) Nasopharyngeal Swab     Status: None   Collection Time: 03/09/21 12:15 PM   Specimen: Nasopharyngeal Swab; Nasopharyngeal(NP) swabs in vial transport medium  Result Value Ref Range Status   SARS Coronavirus 2 by RT PCR NEGATIVE NEGATIVE Final    Comment: (NOTE) SARS-CoV-2 target nucleic acids are NOT DETECTED.  The  SARS-CoV-2 RNA is generally detectable in upper respiratory specimens during the acute phase of infection. The lowest concentration of SARS-CoV-2 viral copies this assay can detect is 138 copies/mL. A negative result does not preclude SARS-Cov-2 infection and should not be used as the sole basis for treatment or other patient management decisions. A negative result may occur with  improper specimen collection/handling, submission of specimen other than nasopharyngeal swab, presence of viral mutation(s) within the areas targeted by this assay, and inadequate number of viral copies(<138 copies/mL). A negative result must be combined with clinical observations, patient history, and epidemiological information. The expected result is Negative.  Fact Sheet for Patients:  EntrepreneurPulse.com.au  Fact Sheet for Healthcare Providers:  IncredibleEmployment.be  This test is no t yet approved or cleared by the [place] FDA and  has been authorized for  detection and/or diagnosis of SARS-CoV-2 by FDA under an Emergency Use Authorization (EUA). This EUA will remain  in effect (meaning this test can be used) for the duration of the COVID-19 declaration under Section 564(b)(1) of the Act, 21 U.S.C.section 360bbb-3(b)(1), unless the authorization is terminated  or revoked sooner.       Influenza A by PCR NEGATIVE NEGATIVE Final   Influenza B by PCR NEGATIVE NEGATIVE Final    Comment: (NOTE) The Xpert Xpress SARS-CoV-2/FLU/RSV plus assay is intended as an aid in the diagnosis of influenza from Nasopharyngeal swab specimens and should not be used as a sole basis for treatment. Nasal washings and aspirates are unacceptable for Xpert Xpress SARS-CoV-2/FLU/RSV testing.  Fact Sheet for Patients: EntrepreneurPulse.com.au  Fact Sheet for Healthcare Providers: IncredibleEmployment.be  This test is not yet approved or cleared by the [place] FDA and has been authorized for detection and/or diagnosis of SARS-CoV-2 by FDA under an Emergency Use Authorization (EUA). This EUA will remain in effect (meaning this test can be used) for the duration of the COVID-19 declaration under Section 564(b)(1) of the Act, 21 U.S.C. section 360bbb-3(b)(1), unless the authorization is terminated or revoked.  Performed at [facility], [address], [place], [place] [address]      Radiological Exams on Admission: DG Chest Portable 1 View  Result Date: 03/09/2021 CLINICAL DATA:  Right hip fracture. EXAM: PORTABLE CHEST 1 VIEW COMPARISON:  None. FINDINGS: The heart size and mediastinal contours are within normal limits. Both lungs are clear. The visualized skeletal structures are unremarkable. IMPRESSION: No active disease. Aortic Atherosclerosis (ICD10-I70.0). Electronically Signed   By: Marijo Conception M.D.   On: 03/09/2021 12:42   DG Hip Unilat W or Wo Pelvis 2-3 Views Right  Result Date:  03/09/2021 CLINICAL DATA:  Right hip pain after fall. EXAM: DG HIP (WITH OR WITHOUT PELVIS) 2-3V RIGHT COMPARISON:  None. FINDINGS: Moderately displaced proximal right femoral neck fracture is noted. IMPRESSION: Moderately displaced proximal right femoral neck fracture. Electronically Signed   By: Marijo Conception M.D.   On: 03/09/2021 12:41     EKG: I have personally reviewed.  Sinus rhythm, QTC 439, low voltage, no ischemic change  Assessment/Plan Principal Problem:   Closed displaced fracture of right femoral neck (HCC) Active Problems:   Hyperlipidemia   Hypertension   Chronic diastolic CHF (congestive heart failure) ([place])   Fall at home, initial encounter   Alcohol use   Closed displaced fracture of right femoral neck ([place]):  As evidenced by x-ray. Patient has severe pain now. No neurovascular compromise. Orthopedic surgeon, Dr. Sabra Heck was consulted.   - will admit to Med-surg  bed - Pain control: morphine prn and percocet - When necessary Zofran for nausea - Robaxin for muscle spasm - type and cross - INR/PTT - PT/OT when able to (not ordered now)  Fall at home, initial encounter: Patient strongly denies head or neck injury.  He states that he is 100% sure that he did not injure his head or neck.  Will not do CT scan of the head or neck. -PT/OT when able to  Hyperlipidemia: off statin now -f/u with PCP  Hypertension: Patient not taking medications currently.  Blood pressure 142/65 -IV hydralazine as needed  Chronic diastolic CHF (congestive heart failure) ([place]): 2D echo 06/13/2019 showed EF> 55% with grade 2 diastolic dysfunction.  Patient does not have leg edema, no shortness of breath.  Chest x-ray showed no pulmonary edema.  CHF is compensated.  Patient is not taking medications currently.  Patient is euvolemic now. -Watch volume status closely  Alcohol use: Patient drinks 2 beers every day, currently no signs of withdrawal -CIWA protocol    Perioperative Cardiac  Risk: pt has multiple comorbidities, including hypertension, hyperlipidemia, dCHF, BPH. No hx of CAD. He is currently not taking blood pressure medications.  Bp is 142/65. Not on diuretics. Currently patient is active and independent of ADLs and, IADLs. No recent acute cardiac issues.  Patient does not have chest pain, shortness of breath, palpitation, leg edema.  No signs of acute CHF exacerbation currently.  EKG has no acute change. At this time point, no further work up is needed. Patient's GUPTA score perioperative myocardial infarction or cardaic arrest is 0.76 %.  I discussed the risk with patient and family, pt would like to proceed for surgery.   DVT ppx: SCD Code Status: Full code Family Communication:   Yes, patient's wife    at bed side Disposition Plan:  Anticipate discharge back to previous environment Consults called:  Dr. Sabra Heck of ortho Admission status and Level of care: Med-Surg:    as inpt     Status is: Inpatient  Remains inpatient appropriate because:Inpatient level of care appropriate due to severity of illness  Dispo: The patient is from: Home              Anticipated d/c is to:  to be determined              Patient currently is not medically stable to d/c.   Difficult to place patient No            Date of Service 03/09/2021    Ivor Costa [facility]   If 7PM-7AM, please contact night-coverage [email or web address] 03/09/2021, 1:49 PM

## 2021-03-10 ENCOUNTER — Inpatient Hospital Stay: Payer: PPO | Admitting: Anesthesiology

## 2021-03-10 ENCOUNTER — Encounter
Admission: EM | Disposition: A | Discharge status: Skilled Nursing Facility | Payer: Self-pay | Source: Home / Self Care | Attending: Internal Medicine

## 2021-03-10 ENCOUNTER — Inpatient Hospital Stay: Payer: PPO

## 2021-03-10 ENCOUNTER — Encounter: Payer: Self-pay | Admitting: Internal Medicine

## 2021-03-10 DIAGNOSIS — Z7289 Other problems related to lifestyle: Secondary | ICD-10-CM

## 2021-03-10 DIAGNOSIS — S72001A Fracture of unspecified part of neck of right femur, initial encounter for closed fracture: Secondary | ICD-10-CM

## 2021-03-10 HISTORY — PX: HIP ARTHROPLASTY: SHX981

## 2021-03-10 HISTORY — DX: Fracture of unspecified part of neck of right femur, initial encounter for closed fracture: S72.001A

## 2021-03-10 LAB — CBC
HCT: 37.8 % — ABNORMAL LOW (ref 39.0–52.0)
HCT: 37.6 % — ABNORMAL LOW (ref 39.0–52.0)
Hemoglobin: 13.5 g/dL (ref 13.0–17.0)
Hemoglobin: 13.1 g/dL (ref 13.0–17.0)
MCH: 33.2 pg (ref 26.0–34.0)
MCH: 32.8 pg (ref 26.0–34.0)
MCHC: 35.7 g/dL (ref 30.0–36.0)
MCHC: 34.8 g/dL (ref 30.0–36.0)
MCV: 92.9 fL (ref 80.0–100.0)
MCV: 94.2 fL (ref 80.0–100.0)
Platelets: 159 10*3/uL (ref 150–400)
Platelets: 167 10*3/uL (ref 150–400)
RBC: 4.07 MIL/uL — ABNORMAL LOW (ref 4.22–5.81)
RBC: 3.99 MIL/uL — ABNORMAL LOW (ref 4.22–5.81)
RDW: 12.2 % (ref 11.5–15.5)
RDW: 12.2 % (ref 11.5–15.5)
WBC: 7.5 10*3/uL (ref 4.0–10.5)
WBC: 10.8 10*3/uL — ABNORMAL HIGH (ref 4.0–10.5)
nRBC: 0 % (ref 0.0–0.2)
nRBC: 0 % (ref 0.0–0.2)

## 2021-03-10 LAB — SURGICAL PCR SCREEN
MRSA, PCR: NEGATIVE
Staphylococcus aureus: NEGATIVE

## 2021-03-10 LAB — CREATININE, SERUM
Creatinine, Ser: 0.87 mg/dL (ref 0.61–1.24)
GFR, Estimated: >60 mL/min (ref >60)

## 2021-03-10 SURGERY — HEMIARTHROPLASTY, HIP, DIRECT ANTERIOR APPROACH, FOR FRACTURE
Anesthesia: Spinal | Site: Hip | Laterality: Right

## 2021-03-10 MED ORDER — FERROUS SULFATE 325 (65 FE) MG PO TABS
325.0000 mg | ORAL_TABLET | Freq: Every day | ORAL | Status: DC
  Administered 2021-03-11 – 2021-03-13 (×3): 325 mg via ORAL
  Filled 2021-03-10 (×3): qty 1

## 2021-03-10 MED ORDER — MIDAZOLAM HCL 2 MG/2ML IJ SOLN
INTRAMUSCULAR | Status: AC
  Filled 2021-03-10: qty 2

## 2021-03-10 MED ORDER — ENOXAPARIN SODIUM 30 MG/0.3ML IJ SOSY
30.0000 mg | PREFILLED_SYRINGE | INTRAMUSCULAR | Status: DC
  Administered 2021-03-11 – 2021-03-13 (×3): 30 mg via SUBCUTANEOUS
  Filled 2021-03-10 (×3): qty 0.3

## 2021-03-10 MED ORDER — CEFAZOLIN SODIUM-DEXTROSE 1-4 GM/50ML-% IV SOLN
INTRAVENOUS | Status: AC
  Filled 2021-03-10: qty 50

## 2021-03-10 MED ORDER — PROPOFOL 500 MG/50ML IV EMUL
INTRAVENOUS | Status: DC | PRN
  Administered 2021-03-10: 50 ug/kg/min via INTRAVENOUS

## 2021-03-10 MED ORDER — ONDANSETRON HCL 4 MG PO TABS: 4.0000 mg | ORAL_TABLET | Freq: Four times a day (QID) | ORAL | Status: DC | PRN

## 2021-03-10 MED ORDER — SODIUM CHLORIDE 0.9 % IR SOLN
Status: DC | PRN
  Administered 2021-03-10: 1000 mL

## 2021-03-10 MED ORDER — PHENOL 1.4 % MT LIQD
1.0000 | OROMUCOSAL | Status: DC | PRN
  Filled 2021-03-10: qty 177

## 2021-03-10 MED ORDER — BISACODYL 10 MG RE SUPP
10.0000 mg | Freq: Every day | RECTAL | Status: DC | PRN
  Administered 2021-03-13: 10 mg via RECTAL
  Filled 2021-03-10: qty 1

## 2021-03-10 MED ORDER — MENTHOL 3 MG MT LOZG
1.0000 | LOZENGE | OROMUCOSAL | Status: DC | PRN
  Filled 2021-03-10: qty 9

## 2021-03-10 MED ORDER — SODIUM CHLORIDE 0.45 % IV SOLN: INTRAVENOUS | Status: DC

## 2021-03-10 MED ORDER — CLINDAMYCIN PHOSPHATE 600 MG/50ML IV SOLN
INTRAVENOUS | Status: AC
  Filled 2021-03-10: qty 50

## 2021-03-10 MED ORDER — PROPOFOL 10 MG/ML IV BOLUS
INTRAVENOUS | Status: DC | PRN
  Administered 2021-03-10 (×2): 40 mg via INTRAVENOUS

## 2021-03-10 MED ORDER — PHENYLEPHRINE HCL (PRESSORS) 10 MG/ML IV SOLN
INTRAVENOUS | Status: DC | PRN
Start: 2021-03-10 — End: 2021-03-10
  Administered 2021-03-10 (×4): 150 ug via INTRAVENOUS

## 2021-03-10 MED ORDER — ONDANSETRON HCL 4 MG/2ML IJ SOLN: 4.0000 mg | Freq: Four times a day (QID) | INTRAMUSCULAR | Status: DC | PRN

## 2021-03-10 MED ORDER — LACTATED RINGERS IV SOLN: INTRAVENOUS | Status: DC | PRN

## 2021-03-10 MED ORDER — BUPIVACAINE HCL (PF) 0.5 % IJ SOLN
INTRAMUSCULAR | Status: AC
  Filled 2021-03-10: qty 10

## 2021-03-10 MED ORDER — DOCUSATE SODIUM 100 MG PO CAPS
100.0000 mg | ORAL_CAPSULE | Freq: Two times a day (BID) | ORAL | Status: DC
  Administered 2021-03-10 – 2021-03-13 (×7): 100 mg via ORAL
  Filled 2021-03-10 (×7): qty 1

## 2021-03-10 MED ORDER — ALUM & MAG HYDROXIDE-SIMETH 200-200-20 MG/5ML PO SUSP: 30.0000 mL | ORAL | Status: DC | PRN

## 2021-03-10 MED ORDER — BUPIVACAINE HCL (PF) 0.5 % IJ SOLN
INTRAMUSCULAR | Status: DC | PRN
  Administered 2021-03-10: 2.5 mL

## 2021-03-10 MED ORDER — ACETAMINOPHEN 325 MG PO TABS: 325.0000 mg | ORAL_TABLET | Freq: Four times a day (QID) | ORAL | Status: DC | PRN

## 2021-03-10 MED ORDER — METHOCARBAMOL 1000 MG/10ML IJ SOLN
500.0000 mg | Freq: Four times a day (QID) | INTRAVENOUS | Status: DC | PRN
  Filled 2021-03-10: qty 5

## 2021-03-10 MED ORDER — CLINDAMYCIN PHOSPHATE 600 MG/50ML IV SOLN
600.0000 mg | Freq: Three times a day (TID) | INTRAVENOUS | Status: AC
  Administered 2021-03-10 – 2021-03-11 (×3): 600 mg via INTRAVENOUS
  Filled 2021-03-10 (×3): qty 50

## 2021-03-10 MED ORDER — HYDROCODONE-ACETAMINOPHEN 5-325 MG PO TABS
1.0000 | ORAL_TABLET | ORAL | Status: DC | PRN
  Administered 2021-03-10 – 2021-03-11 (×2): 2 via ORAL
  Filled 2021-03-10 (×2): qty 2

## 2021-03-10 MED ORDER — MIDAZOLAM HCL 5 MG/5ML IJ SOLN
INTRAMUSCULAR | Status: DC | PRN
  Administered 2021-03-10: 2 mg via INTRAVENOUS

## 2021-03-10 MED ORDER — NEOMYCIN-POLYMYXIN B GU 40-200000 IR SOLN
Status: DC | PRN
  Administered 2021-03-10: 8 mL

## 2021-03-10 MED ORDER — CEFAZOLIN SODIUM-DEXTROSE 2-4 GM/100ML-% IV SOLN
2.0000 g | Freq: Three times a day (TID) | INTRAVENOUS | Status: AC
  Administered 2021-03-10 – 2021-03-11 (×3): 2 g via INTRAVENOUS
  Filled 2021-03-10 (×3): qty 100

## 2021-03-10 MED ORDER — FENTANYL CITRATE (PF) 100 MCG/2ML IJ SOLN: 25.0000 ug | INTRAMUSCULAR | Status: DC | PRN

## 2021-03-10 MED ORDER — PROPOFOL 500 MG/50ML IV EMUL
INTRAVENOUS | Status: AC
  Filled 2021-03-10: qty 50

## 2021-03-10 MED ORDER — METOCLOPRAMIDE HCL 5 MG/ML IJ SOLN: 5.0000 mg | Freq: Three times a day (TID) | INTRAMUSCULAR | Status: DC | PRN

## 2021-03-10 MED ORDER — BUPIVACAINE-EPINEPHRINE (PF) 0.25% -1:200000 IJ SOLN
INTRAMUSCULAR | Status: DC | PRN
  Administered 2021-03-10: 30 mL via PERINEURAL

## 2021-03-10 MED ORDER — METOCLOPRAMIDE HCL 10 MG PO TABS: 5.0000 mg | ORAL_TABLET | Freq: Three times a day (TID) | ORAL | Status: DC | PRN

## 2021-03-10 MED ORDER — MORPHINE SULFATE (PF) 2 MG/ML IV SOLN: 0.5000 mg | INTRAVENOUS | Status: DC | PRN

## 2021-03-10 MED ORDER — SODIUM CHLORIDE 0.9 % IV SOLN
INTRAVENOUS | Status: DC | PRN
  Administered 2021-03-10: 50 ug/min via INTRAVENOUS

## 2021-03-10 MED ORDER — ZOLPIDEM TARTRATE 5 MG PO TABS: 5.0000 mg | ORAL_TABLET | Freq: Every evening | ORAL | Status: DC | PRN

## 2021-03-10 MED ORDER — HYDROCODONE-ACETAMINOPHEN 7.5-325 MG PO TABS
1.0000 | ORAL_TABLET | ORAL | Status: DC | PRN
  Administered 2021-03-11 – 2021-03-13 (×6): 2 via ORAL
  Filled 2021-03-10 (×6): qty 2

## 2021-03-10 MED ORDER — PROPOFOL 10 MG/ML IV BOLUS
INTRAVENOUS | Status: AC
  Filled 2021-03-10: qty 20

## 2021-03-10 MED ORDER — 0.9 % SODIUM CHLORIDE (POUR BTL) OPTIME
TOPICAL | Status: DC | PRN
  Administered 2021-03-10: 1000 mL

## 2021-03-10 MED ORDER — METHOCARBAMOL 500 MG PO TABS: 500.0000 mg | ORAL_TABLET | Freq: Four times a day (QID) | ORAL | Status: DC | PRN

## 2021-03-10 MED ORDER — PHENYLEPHRINE HCL (PRESSORS) 10 MG/ML IV SOLN
INTRAVENOUS | Status: AC
  Filled 2021-03-10: qty 1

## 2021-03-10 MED ORDER — FLEET ENEMA 7-19 GM/118ML RE ENEM: 1.0000 | ENEMA | Freq: Once | RECTAL | Status: DC | PRN

## 2021-03-10 SURGICAL SUPPLY — 54 items
BLADE SAGITTAL WIDE XTHICK NO (BLADE) ×2 IMPLANT
BLADE SURG SZ10 CARB STEEL (BLADE) ×2 IMPLANT
CHLORAPREP W/TINT 26 (MISCELLANEOUS) ×4 IMPLANT
COVER BACK TABLE REUSABLE LG (DRAPES) ×2 IMPLANT
DRAPE INCISE IOBAN 66X60 STRL (DRAPES) ×4 IMPLANT
DRSG AQUACEL AG ADV 3.5X10 (GAUZE/BANDAGES/DRESSINGS) ×2 IMPLANT
DRSG AQUACEL AG ADV 3.5X14 (GAUZE/BANDAGES/DRESSINGS) ×2 IMPLANT
ELECT BLADE 6.5 EXT (BLADE) ×2 IMPLANT
ELECT CAUTERY BLADE 6.4 (BLADE) ×2 IMPLANT
ELECT REM PT RETURN 9FT ADLT (ELECTROSURGICAL) ×2
ELECTRODE REM PT RTRN 9FT ADLT (ELECTROSURGICAL) ×1 IMPLANT
GAUZE 4X4 16PLY ~~LOC~~+RFID DBL (SPONGE) ×2 IMPLANT
GAUZE SPONGE 4X4 12PLY STRL (GAUZE/BANDAGES/DRESSINGS) ×3 IMPLANT
GAUZE XEROFORM 1X8 LF (GAUZE/BANDAGES/DRESSINGS) ×3 IMPLANT
GLOVE SURG ORTHO LTX SZ8.5 (GLOVE) ×2 IMPLANT
GLOVE SURG UNDER LTX SZ8 (GLOVE) ×2 IMPLANT
GOWN STRL REUS W/ TWL LRG LVL3 (GOWN DISPOSABLE) ×2 IMPLANT
GOWN STRL REUS W/TWL LRG LVL3 (GOWN DISPOSABLE) ×2
GOWN STRL REUS W/TWL LRG LVL4 (GOWN DISPOSABLE) ×2 IMPLANT
HEAD MODULAR ENDO (Orthopedic Implant) ×1 IMPLANT
HEAD UNPLR 51XMDLR STRL HIP (Orthopedic Implant) IMPLANT
HEMOVAC 400CC 10FR (MISCELLANEOUS) ×2 IMPLANT
IV NS 1000ML (IV SOLUTION) ×1
IV NS 1000ML BAXH (IV SOLUTION) ×1 IMPLANT
KIT TURNOVER KIT A (KITS) ×2 IMPLANT
MANIFOLD NEPTUNE II (INSTRUMENTS) ×2 IMPLANT
NDL FILTER BLUNT 18X1 1/2 (NEEDLE) ×1 IMPLANT
NDL SPNL 18GX3.5 QUINCKE PK (NEEDLE) ×2 IMPLANT
NEEDLE FILTER BLUNT 18X 1/2SAF (NEEDLE) ×1
NEEDLE FILTER BLUNT 18X1 1/2 (NEEDLE) ×1 IMPLANT
NEEDLE SPNL 18GX3.5 QUINCKE PK (NEEDLE) ×4 IMPLANT
NS IRRIG 1000ML POUR BTL (IV SOLUTION) ×2 IMPLANT
PACK HIP PROSTHESIS (MISCELLANEOUS) ×2 IMPLANT
PAD ABD DERMACEA PRESS 5X9 (GAUZE/BANDAGES/DRESSINGS) ×1 IMPLANT
PULSAVAC PLUS IRRIG FAN TIP (DISPOSABLE) ×2
SLEEVE UNITRAX (Orthopedic Implant) ×1 IMPLANT
SOL PREP PVP 2OZ (MISCELLANEOUS) ×2
SOLUTION PREP PVP 2OZ (MISCELLANEOUS) ×1 IMPLANT
SPONGE T-LAP 18X18 ~~LOC~~+RFID (SPONGE) ×8 IMPLANT
STAPLER SKIN PROX 35W (STAPLE) ×2 IMPLANT
STEM HIP 127 DEG (Stem) ×1 IMPLANT
SUT DVC 2 QUILL PDO  T11 36X36 (SUTURE) ×2
SUT DVC 2 QUILL PDO T11 36X36 (SUTURE) ×2 IMPLANT
SUT QUILL PDO 0 36 36 VIOLET (SUTURE) ×2 IMPLANT
SUT QUILL PDO 2 24X24 VLT (SUTURE) ×2 IMPLANT
SUT TICRON 2-0 30IN 311381 (SUTURE) ×4 IMPLANT
SYR 10ML LL (SYRINGE) ×2 IMPLANT
SYR 30ML LL (SYRINGE) ×2 IMPLANT
SYR 50ML LL SCALE MARK (SYRINGE) ×2 IMPLANT
TAPE MICROFOAM 4IN (TAPE) ×2 IMPLANT
TIP FAN IRRIG PULSAVAC PLUS (DISPOSABLE) ×1 IMPLANT
TUBE SUCT KAM VAC (TUBING) ×2 IMPLANT
WATER STERILE IRR 1000ML POUR (IV SOLUTION) ×2 IMPLANT
WATER STERILE IRR 500ML POUR (IV SOLUTION) ×2 IMPLANT

## 2021-03-10 NOTE — Plan of Care (Signed)
  Problem: Education: Goal: Knowledge of General Education information will improve Description: Including pain rating scale, medication(s)/side effects and non-pharmacologic comfort measures Outcome: Progressing   Problem: Health Behavior/Discharge Planning: Goal: Ability to manage health-related needs will improve Outcome: Progressing   Problem: Clinical Measurements: Goal: Will remain free from infection Outcome: Completed/Met Goal: Diagnostic test results will improve Outcome: Completed/Met   Problem: Coping: Goal: Level of anxiety will decrease Outcome: Progressing   Problem: Elimination: Goal: Will not experience complications related to bowel motility Outcome: Completed/Met Goal: Will not experience complications related to urinary retention Outcome: Completed/Met

## 2021-03-10 NOTE — Anesthesia Preprocedure Evaluation (Signed)
Anesthesia Evaluation  Patient identified by MRN, date of birth, ID band Patient awake    Reviewed: Allergy & Precautions, H&P , NPO status , Patient's Chart, lab work & pertinent test results, reviewed documented beta blocker date and time   History of Anesthesia Complications Negative for: history of anesthetic complications  Airway Mallampati: III  TM Distance: >3 FB Neck ROM: full    Dental  (+) Caps, Chipped, Dental Advidsory Given   Pulmonary neg pulmonary ROS, former smoker,           Cardiovascular Exercise Tolerance: Good hypertension, (-) angina+CHF (EF 55%, grade 2 dCHF)  (-) dysrhythmias      Neuro/Psych negative neurological ROS  negative psych ROS   GI/Hepatic negative GI ROS, Neg liver ROS,   Endo/Other  negative endocrine ROS  Renal/GU negative Renal ROS  negative genitourinary   Musculoskeletal   Abdominal   Peds  Hematology negative hematology ROS (+)   Anesthesia Other Findings Past Medical History: No date: Adenomatous polyps No date: BPH (benign prostatic hyperplasia) No date: DDD (degenerative disc disease), cervical No date: Elevated PSA No date: Hyperlipidemia No date: Hypertension   Reproductive/Obstetrics negative OB ROS                             Anesthesia Physical  Anesthesia Plan  ASA: 2  Anesthesia Plan: Spinal   Post-op Pain Management:    Induction:   PONV Risk Score and Plan: 1 and TIVA and Propofol infusion  Airway Management Planned: Natural Airway and Simple Face Mask  Additional Equipment:   Intra-op Plan:   Post-operative Plan:   Informed Consent: I have reviewed the patients History and Physical, chart, labs and discussed the procedure including the risks, benefits and alternatives for the proposed anesthesia with the patient or authorized representative who has indicated his/her understanding and acceptance.     Dental  Advisory Given  Plan Discussed with: Anesthesiologist, CRNA and Surgeon  Anesthesia Plan Comments:         Anesthesia Quick Evaluation

## 2021-03-10 NOTE — Anesthesia Procedure Notes (Signed)
Spinal  Patient location during procedure: OR Start time: 03/10/2021 8:25 AM End time: 03/10/2021 8:40 AM Reason for block: surgical anesthesia Staffing Performed: resident/CRNA  Resident/CRNA: Esaw Grandchild, CRNA Preanesthetic Checklist Completed: patient identified, IV checked, site marked, risks and benefits discussed, surgical consent, monitors and equipment checked, pre-op evaluation and timeout performed Spinal Block Patient position: left lateral decubitus Prep: ChloraPrep Patient monitoring: heart rate, continuous pulse ox and blood pressure Approach: midline Location: L2-3 Injection technique: single-shot Needle Needle type: Pencan  Needle gauge: 24 G Needle length: 10 cm Assessment Sensory level: T6 Events: CSF return

## 2021-03-10 NOTE — Transfer of Care (Signed)
Immediate Anesthesia Transfer of Care Note  Patient: Alan Brock  Procedure(s) Performed: ARTHROPLASTY BIPOLAR HIP (HEMIARTHROPLASTY) (Right: Hip)  Patient Location: PACU  Anesthesia Type:Spinal  Level of Consciousness: drowsy  Airway & Oxygen Therapy: Patient Spontanous Breathing and Patient connected to face mask oxygen  Post-op Assessment: Report given to RN and Post -op Vital signs reviewed and stable  Post vital signs: Reviewed and stable  Last Vitals:  Vitals Value Taken Time  BP 103/68 03/10/21 1033  Temp    Pulse 69 03/10/21 1040  Resp 15 03/10/21 1040  SpO2 95 % 03/10/21 1040  Vitals shown include unvalidated device data.  Last Pain:  Vitals:   03/10/21 0611  TempSrc:   PainSc: 10-Worst pain ever      Patients Stated Pain Goal: 0 ([id number])  Complications: No notable events documented.

## 2021-03-10 NOTE — Op Note (Signed)
03/10/2021  10:41 AM  PATIENT:  Alan Brock   MRN: [id number]  PRE-OPERATIVE DIAGNOSIS:  Displaced Subcapital fracture Right hip   POST-OPERATIVE DIAGNOSIS: Same  PROCEDURE:  Right   hip hemiarthroplasty with Stryker Accolade prosthesis  PREOPERATIVE INDICATIONS:  Alan Brock is an 75 y.o. male who was admitted 03/09/2021 with a diagnosis of displaced subcapital fracture of the hip and elected for surgical management.  The risks benefits and alternatives were discussed with the patient including but not limited to the risks of nonoperative treatment, versus surgical intervention including infection, bleeding, nerve injury, periprosthetic fracture, the need for revision surgery, dislocation, leg length discrepancy, blood clots, cardiopulmonary complications, morbidity, mortality, among others, and they were willing to proceed.  Predicted outcome is good, although there will be at least a six to nine month expected recovery.     SURGEON:  Earnestine Leys, MD  ASST:    ANESTHESIA: Spinal    COMPLICATIONS:  None.   EBL:  50 cc    COMPONENTS:  Stryker Accolade Femoral Fracture stem size # 5  ,   and a size   51 mm  fracture head unipolar hip ball with    -4 mm  neck length.    PROCEDURE IN DETAIL: The patient was met in the holding area and identified.  The appropriate hip  was marked at the operative site. The patient was then transported to the OR and  placed under general anesthesia.  At that point, the patient was  placed in the lateral decubitus position with the operative side up and  secured to the operating room table and all bony prominences padded.     The operative lower extremity was prepped from the iliac crest to the toes.  Sterile draping was performed.  Time out was performed prior to incision.      A routine posterolateral approach was utilized via sharp dissection  carried down to the subcutaneous tissue.  Gross bleeders were Bovie  coagulated.  The iliotibial band was  identified and incised  along the length of the skin incision.  Self-retaining retractors were  inserted.  With the hip internally rotated, the short external rotators  were identified. The piriformis was tagged and the hip capsule released in a T-type fashion.  The femoral neck was exposed, and I resected the femoral neck using the appropriate jig. This was performed at approximately a thumb's breadth above the lesser trochanter.    I then exposed the deep acetabulum, cleared out any tissue including the ligamentum teres.    I then prepared the proximal femur using the cookie-cutter, the lateralizing reamer, and then sequentially broached.  A trial stem   was  utilized along with a unipolar head and neck.  I reduced the hip and it was found to have excellent stability with functional range of motion. Leg lengths were equal.  The trial components were then removed.   The same size Accolade femoral stem was then inserted and was very stable.  The Unitrax head and neck as trialed were inserted as well.     The hip was then reduced and taken through functional range of motion and found to have excellent stability. Leg lengths were restored.     I closed the T in the capsule with #2 Ticron as well as the short external rotators. A hemovac was inserted.    I then irrigated the hip copiously again with pulse lavage, and repaired the fascia with #2 Quill and the subcutaneous  layer with #0 Quill. Sponge and needle counts were correct. Dry sterile Aquacell was applied.   The patient was then awakened and returned to PACU in stable and satisfactory condition. There were no complications.  Park Breed, MD Orthopedic Surgeon [phone]   03/10/2021 10:41 AM

## 2021-03-10 NOTE — H&P (Signed)
THE PATIENT WAS SEEN PRIOR TO SURGERY TODAY.  HISTORY, ALLERGIES, HOME MEDICATIONS AND OPERATIVE PROCEDURE WERE REVIEWED. RISKS AND BENEFITS OF SURGERY DISCUSSED WITH PATIENT AGAIN.  NO CHANGES FROM INITIAL HISTORY AND PHYSICAL NOTED.    

## 2021-03-10 NOTE — Progress Notes (Signed)
PROGRESS NOTE    Alan Brock  [id number] DOB: [birth year] DOA: 03/09/2021 PCP: Idelle Crouch, MD   Assessment & Plan:   Principal Problem:   Closed displaced fracture of right femoral neck ([place]) Active Problems:   Hyperlipidemia   Hypertension   Chronic diastolic CHF (congestive heart failure) ([place])   Fall at home, initial encounter   Alcohol use   Closed displaced fracture of right femoral neck: likely secondary to fall at home. Morphine, percocet prn for pain. S/p right hip hemiarthroplasty 8/28 as per othro surg   HLD: not on a statin currently    HTN: not on any anti-HTN meds currently    Chronic diastolic CHF: echo [id number] showed EF > [id number] 2 diastolic dysfunction. CHF is compensated.  Not taking any CHF meds at currently    Alcohol use: drinks 2 beers every day, currently no signs of withdrawal. Continue on CIWA protocol    DVT prophylaxis: lovenox  Code Status: full  Family Communication:  Disposition Plan: likely d/c to SNF  Level of care: Med-Surg  Status is: Inpatient  Remains inpatient appropriate because:Unsafe d/c plan, IV treatments appropriate due to intensity of illness or inability to take PO, and Inpatient level of care appropriate due to severity of illness  Dispo: The patient is from: Home              Anticipated d/c is to: SNF              Patient currently is not medically stable to d/c.   Difficult to place patient : unclear   Consultants:    Procedures:   Antimicrobials:    Subjective: Pt c/o hip pain   Objective: Vitals:   03/09/21 1900 03/09/21 1948 03/10/21 0523 03/10/21 0720  BP: (!) 145/69 (!) 145/69 (!) 151/77 135/73  Pulse: 71 73 83 81  Resp:  '18 20 16  '$ Temp:  98.6 F (37 C) 98 F (36.7 C) 98.5 F (36.9 C)  TempSrc:  Oral    SpO2: 100% 100% 98% 100%    Intake/Output Summary (Last 24 hours) at 03/10/2021 0740 Last data filed at 03/10/2021 0500 Gross per 24 hour  Intake 360 ml  Output 700 ml  Net -340  ml   There were no vitals filed for this visit.  Examination:  General exam: Appears calm and comfortable  Respiratory system: Clear to auscultation. Respiratory effort normal. Cardiovascular system: S1 & S2 +. No  rubs, gallops or clicks. Gastrointestinal system: Abdomen is nondistended, soft and nontender. Normal bowel sounds heard. Central nervous system: Alert and oriented. Moves all extremities  Psychiatry: Judgement and insight appear normal. Mood & affect appropriate.     Data Reviewed: I have personally reviewed following labs and imaging studies  CBC: Recent Labs  Lab 03/09/21 1208 03/10/21 0505  WBC 5.4 7.5  NEUTROABS 3.5  --   HGB 13.6 13.5  HCT 38.5* 37.8*  MCV 92.3 92.9  PLT 167 [id number]   Basic Metabolic Panel: Recent Labs  Lab 03/09/21 1208  NA 139  K 3.7  CL 102  CO2 28  GLUCOSE 108*  BUN 19  CREATININE 1.03  CALCIUM 9.1   GFR: CrCl cannot be calculated (Unknown ideal weight.). Liver Function Tests: No results for input(s): AST, ALT, ALKPHOS, BILITOT, PROT, ALBUMIN in the last 168 hours. No results for input(s): LIPASE, AMYLASE in the last 168 hours. No results for input(s): AMMONIA in the last 168 hours. Coagulation Profile: Recent Labs  Lab 03/09/21  1208  INR 1.0   Cardiac Enzymes: No results for input(s): CKTOTAL, CKMB, CKMBINDEX, TROPONINI in the last 168 hours. BNP (last 3 results) No results for input(s): PROBNP in the last 8760 hours. HbA1C: No results for input(s): HGBA1C in the last 72 hours. CBG: No results for input(s): GLUCAP in the last 168 hours. Lipid Profile: No results for input(s): CHOL, HDL, LDLCALC, TRIG, CHOLHDL, LDLDIRECT in the last 72 hours. Thyroid Function Tests: No results for input(s): TSH, T4TOTAL, FREET4, T3FREE, THYROIDAB in the last 72 hours. Anemia Panel: No results for input(s): VITAMINB12, FOLATE, FERRITIN, TIBC, IRON, RETICCTPCT in the last 72 hours. Sepsis Labs: No results for input(s): PROCALCITON,  LATICACIDVEN in the last 168 hours.  Recent Results (from the past 240 hour(s))  Resp Panel by RT-PCR (Flu A&B, Covid) Nasopharyngeal Swab     Status: None   Collection Time: 03/09/21 12:15 PM   Specimen: Nasopharyngeal Swab; Nasopharyngeal(NP) swabs in vial transport medium  Result Value Ref Range Status   SARS Coronavirus 2 by RT PCR NEGATIVE NEGATIVE Final    Comment: (NOTE) SARS-CoV-2 target nucleic acids are NOT DETECTED.  The SARS-CoV-2 RNA is generally detectable in upper respiratory specimens during the acute phase of infection. The lowest concentration of SARS-CoV-2 viral copies this assay can detect is 138 copies/mL. A negative result does not preclude SARS-Cov-2 infection and should not be used as the sole basis for treatment or other patient management decisions. A negative result may occur with  improper specimen collection/handling, submission of specimen other than nasopharyngeal swab, presence of viral mutation(s) within the areas targeted by this assay, and inadequate number of viral copies(<138 copies/mL). A negative result must be combined with clinical observations, patient history, and epidemiological information. The expected result is Negative.  Fact Sheet for Patients:  EntrepreneurPulse.com.au  Fact Sheet for Healthcare Providers:  IncredibleEmployment.be  This test is no t yet approved or cleared by the [place] FDA and  has been authorized for detection and/or diagnosis of SARS-CoV-2 by FDA under an Emergency Use Authorization (EUA). This EUA will remain  in effect (meaning this test can be used) for the duration of the COVID-19 declaration under Section 564(b)(1) of the Act, 21 U.S.C.section 360bbb-3(b)(1), unless the authorization is terminated  or revoked sooner.       Influenza A by PCR NEGATIVE NEGATIVE Final   Influenza B by PCR NEGATIVE NEGATIVE Final    Comment: (NOTE) The Xpert Xpress  SARS-CoV-2/FLU/RSV plus assay is intended as an aid in the diagnosis of influenza from Nasopharyngeal swab specimens and should not be used as a sole basis for treatment. Nasal washings and aspirates are unacceptable for Xpert Xpress SARS-CoV-2/FLU/RSV testing.  Fact Sheet for Patients: EntrepreneurPulse.com.au  Fact Sheet for Healthcare Providers: IncredibleEmployment.be  This test is not yet approved or cleared by the [place] FDA and has been authorized for detection and/or diagnosis of SARS-CoV-2 by FDA under an Emergency Use Authorization (EUA). This EUA will remain in effect (meaning this test can be used) for the duration of the COVID-19 declaration under Section 564(b)(1) of the Act, 21 U.S.C. section 360bbb-3(b)(1), unless the authorization is terminated or revoked.  Performed at [facility], [address], [place], [place] [address]   Surgical pcr screen     Status: None   Collection Time: 03/10/21  1:45 AM   Specimen: Nasal Mucosa; Nasal Swab  Result Value Ref Range Status   MRSA, PCR NEGATIVE NEGATIVE Final   Staphylococcus aureus NEGATIVE NEGATIVE Final  Comment: (NOTE) The Xpert SA Assay (FDA approved for NASAL specimens in patients 40 years of age and older), is one component of a comprehensive surveillance program. It is not intended to diagnose infection nor to guide or monitor treatment. Performed at [facility], [address] [place], [place] [address]          Radiology Studies: DG Chest Portable 1 View  Result Date: 03/09/2021 CLINICAL DATA:  Right hip fracture. EXAM: PORTABLE CHEST 1 VIEW COMPARISON:  None. FINDINGS: The heart size and mediastinal contours are within normal limits. Both lungs are clear. The visualized skeletal structures are unremarkable. IMPRESSION: No active disease. Aortic Atherosclerosis (ICD10-I70.0). Electronically Signed   By: Marijo Conception M.D.   On:  03/09/2021 12:42   DG Hip Unilat W or Wo Pelvis 2-3 Views Right  Result Date: 03/09/2021 CLINICAL DATA:  Right hip pain after fall. EXAM: DG HIP (WITH OR WITHOUT PELVIS) 2-3V RIGHT COMPARISON:  None. FINDINGS: Moderately displaced proximal right femoral neck fracture is noted. IMPRESSION: Moderately displaced proximal right femoral neck fracture. Electronically Signed   By: Marijo Conception M.D.   On: 03/09/2021 12:41        Scheduled Meds:  [START ON 03/11/2021] aspirin  81 mg Oral Daily   chlorhexidine  1 application Topical Once   folic acid  1 mg Oral Daily   gabapentin  300 mg Oral QHS   LORazepam  0-4 mg Oral Q6H   Followed by   Derrill Memo ON 03/11/2021] LORazepam  0-4 mg Oral Q12H   multivitamin with minerals  1 tablet Oral Daily   thiamine  100 mg Oral Daily   Or   thiamine  100 mg Intravenous Daily   Continuous Infusions:   ceFAZolin (ANCEF) IV     clindamycin (CLEOCIN) IV       LOS: 1 day    Time spent: 30 mins     Wyvonnia Dusky, MD [facility] Pager [phone]  If 7PM-7AM, please contact night-coverage 03/10/2021, 7:40 AM

## 2021-03-11 ENCOUNTER — Encounter: Payer: Self-pay | Admitting: Specialist

## 2021-03-11 DIAGNOSIS — D696 Thrombocytopenia, unspecified: Secondary | ICD-10-CM

## 2021-03-11 DIAGNOSIS — E440 Moderate protein-calorie malnutrition: Secondary | ICD-10-CM | POA: Insufficient documentation

## 2021-03-11 LAB — CBC
HCT: 34.9 % — ABNORMAL LOW (ref 39.0–52.0)
Hemoglobin: 11.8 g/dL — ABNORMAL LOW (ref 13.0–17.0)
MCH: 31.6 pg (ref 26.0–34.0)
MCHC: 33.8 g/dL (ref 30.0–36.0)
MCV: 93.3 fL (ref 80.0–100.0)
Platelets: 147 10*3/uL — ABNORMAL LOW (ref 150–400)
RBC: 3.74 MIL/uL — ABNORMAL LOW (ref 4.22–5.81)
RDW: 12.1 % (ref 11.5–15.5)
WBC: 10.3 10*3/uL (ref 4.0–10.5)
nRBC: 0 % (ref 0.0–0.2)

## 2021-03-11 LAB — BASIC METABOLIC PANEL
Anion gap: 8 (ref 5–15)
BUN: 11 mg/dL (ref 8–23)
CO2: 26 mmol/L (ref 22–32)
Calcium: 8.1 mg/dL — ABNORMAL LOW (ref 8.9–10.3)
Chloride: 95 mmol/L — ABNORMAL LOW (ref 98–111)
Creatinine, Ser: 0.75 mg/dL (ref 0.61–1.24)
GFR, Estimated: >60 mL/min (ref >60)
Glucose, Bld: 128 mg/dL — ABNORMAL HIGH (ref 70–99)
Potassium: 3.6 mmol/L (ref 3.5–5.1)
Sodium: 129 mmol/L — ABNORMAL LOW (ref 135–145)

## 2021-03-11 MED ORDER — ENSURE ENLIVE PO LIQD
237.0000 mL | Freq: Two times a day (BID) | ORAL | Status: DC
  Administered 2021-03-11 – 2021-03-13 (×5): 237 mL via ORAL

## 2021-03-11 MED ORDER — TRAMADOL HCL 50 MG PO TABS: 50.0000 mg | ORAL_TABLET | Freq: Four times a day (QID) | ORAL | Status: DC | PRN

## 2021-03-11 NOTE — Progress Notes (Signed)
Subjective: 1 Day Post-Op Procedure(s) (LRB): ARTHROPLASTY BIPOLAR HIP (HEMIARTHROPLASTY) (Right) Patient is very somnolent today.  Pain medicine seems to be knocking him out.  Pain is much better than preop.  Otherwise doing well.  Patient reports pain as mild.  Objective:   VITALS:   Vitals:   03/11/21 0552 03/11/21 0832  BP: 110/62 (!) 154/69  Pulse: (!) 109 100  Resp: 18 16  Temp: 97.9 F (36.6 C) 98.1 F (36.7 C)  SpO2: 100% (!) 85%    Neurologically intact Incision: no drainage  LABS Recent Labs    03/10/21 0505 03/10/21 1304 03/11/21 0445  HGB 13.5 13.1 11.8*  HCT 37.8* 37.6* 34.9*  WBC 7.5 10.8* 10.3  PLT 159 167 147*    Recent Labs    03/09/21 1208 03/10/21 1304 03/11/21 0445  NA 139  --  129*  K 3.7  --  3.6  BUN 19  --  11  CREATININE 1.03 0.87 0.75  GLUCOSE 108*  --  128*    Recent Labs    03/09/21 1208  INR 1.0     Assessment/Plan: 1 Day Post-Op Procedure(s) (LRB): ARTHROPLASTY BIPOLAR HIP (HEMIARTHROPLASTY) (Right)   Advance diet Up with therapy  We will cut back on his pain medicines. He should probably be able to go home and 2 days. We will discharge on aspirin only.

## 2021-03-11 NOTE — Anesthesia Postprocedure Evaluation (Signed)
Anesthesia Post Note  Patient: Alan Brock  Procedure(s) Performed: ARTHROPLASTY BIPOLAR HIP (HEMIARTHROPLASTY) (Right: Hip)  Patient location during evaluation: Nursing Unit Anesthesia Type: Spinal Level of consciousness: oriented and awake and alert Pain management: pain level controlled Vital Signs Assessment: post-procedure vital signs reviewed and stable Respiratory status: spontaneous breathing and respiratory function stable Cardiovascular status: blood pressure returned to baseline and stable Postop Assessment: no headache, no backache, no apparent nausea or vomiting and patient able to bend at knees Anesthetic complications: no   No notable events documented.   Last Vitals:  Vitals:   03/11/21 0552 03/11/21 0832  BP: 110/62 (!) 154/69  Pulse: (!) 109 100  Resp: 18 16  Temp: 36.6 C 36.7 C  SpO2: 100% (!) 85%    Last Pain:  Vitals:   03/11/21 0552  TempSrc: Oral  PainSc:                  [name]

## 2021-03-11 NOTE — Evaluation (Signed)
Physical Therapy Evaluation Patient Details Name: Alan Brock MRN: [id number] DOB: [birth year] Today's Date: 03/11/2021   History of Present Illness  Pt admitted for R hip fx s/p falling from ladder. Pt is s/p R hip hemi on 03/10/21. History includes HLD, HTN, fall, and alcohol abuse.  Clinical Impression  Pt is a pleasant 75 year old male who was admitted for R hip fx and is now s/p R hip hemiarthroplasty. Pt performs bed mobility with mod A +2. Very lethargic, not safe for continued mobility efforts. Will reattempt in PM session. Pt demonstrates deficits with strength/mobility/pain. Will need education on post hip precautions. Would benefit from skilled PT to address above deficits and promote optimal return to PLOF; recommend transition to STR upon discharge from acute hospitalization.     Follow Up Recommendations SNF (family hopeful for home dc pending progress)    Equipment Recommendations  Rolling walker with 5" wheels;3in1 (PT)    Recommendations for Other Services       Precautions / Restrictions Precautions Precautions: Posterior Hip;Fall Precaution Booklet Issued: No Restrictions Weight Bearing Restrictions: Yes RLE Weight Bearing: Partial weight bearing      Mobility  Bed Mobility Overal bed mobility: Needs Assistance Bed Mobility: Supine to Sit     Supine to sit: Mod assist;+2 for physical assistance     General bed mobility comments: needs cues for sequencing. Once seated upright, still remains lethargic. Unable to further perform transfers at this time.    Transfers                 General transfer comment: not safe  Ambulation/Gait                Stairs            Wheelchair Mobility    Modified Rankin (Stroke Patients Only)       Balance Overall balance assessment: Needs assistance Sitting-balance support: Feet supported Sitting balance-Leahy Scale: Fair Sitting balance - Comments: L lateral lean                                      Pertinent Vitals/Pain Pain Assessment: Faces Faces Pain Scale: Hurts even more Pain Location: R LE Pain Descriptors / Indicators: Aching;Dull;Discomfort;Operative site guarding Pain Intervention(s): Limited activity within patient's tolerance;Ice applied;Premedicated before session    Home Living Family/patient expects to be discharged to:: Private residence Living Arrangements: Spouse/significant other Available Help at Discharge: Family Type of Home: House Home Access: Stairs to enter Entrance Stairs-Rails:  (B wide rails) Entrance Stairs-Number of Steps: 4 Home Layout: Two level;Able to live on main level with bedroom/bathroom Home Equipment: [name]      Prior Function Level of Independence: Independent         Comments: active, no falls prior     Hand Dominance        Extremity/Trunk Assessment   Upper Extremity Assessment Upper Extremity Assessment: Overall WFL for tasks assessed    Lower Extremity Assessment Lower Extremity Assessment: Generalized weakness (R LE grossly 3/5; L LE grossly 4/5)       Communication   Communication: No difficulties  Cognition Arousal/Alertness: Lethargic Behavior During Therapy: Flat affect Overall Cognitive Status: Impaired/Different from baseline  General Comments: very lethargic      General Comments      Exercises Other Exercises Other Exercises: supine therex performed on R LE including QS and SLR. 10 reps. Lethargy limiting mobility   Assessment/Plan    PT Assessment Patient needs continued PT services  PT Problem List Decreased strength;Decreased balance;Decreased mobility;Pain       PT Treatment Interventions DME instruction;Gait training;Therapeutic exercise;Balance training    PT Goals (Current goals can be found in the Care Plan section)  Acute Rehab PT Goals Patient Stated Goal: to get stronger PT Goal  Formulation: With patient Time For Goal Achievement: 03/25/21 Potential to Achieve Goals: Good    Frequency BID   Barriers to discharge        Co-evaluation               AM-PAC PT "6 Clicks" Mobility  Outcome Measure Help needed turning from your back to your side while in a flat bed without using bedrails?: A Lot Help needed moving from lying on your back to sitting on the side of a flat bed without using bedrails?: A Lot Help needed moving to and from a bed to a chair (including a wheelchair)?: Total Help needed standing up from a chair using your arms (e.g., wheelchair or bedside chair)?: Total Help needed to walk in hospital room?: Total Help needed climbing 3-5 steps with a railing? : Total 6 Click Score: 8    End of Session   Activity Tolerance: Patient limited by pain Patient left: in bed;with bed alarm set;with family/visitor present;with SCD's reapplied Nurse Communication: Mobility status PT Visit Diagnosis: Muscle weakness (generalized) (M62.81);History of falling (Z91.81);Difficulty in walking, not elsewhere classified (R26.2);Pain Pain - Right/Left: Right Pain - part of body: Hip    Time: [id number] PT Time Calculation (min) (ACUTE ONLY): 26 min   Charges:   PT Evaluation $PT Eval Low Complexity: 1 Low PT Treatments $Therapeutic Exercise: 8-22 mins        Greggory Stallion, PT, DPT [phone]   , 03/11/2021, 12:12 PM

## 2021-03-11 NOTE — TOC Progression Note (Signed)
Transition of Care [facility]) - Progression Note    Patient Details  Name: Alan Brock MRN: [id number] Date of Birth: 1946-01-11  Transition of [facility]) CM/SW [place], RN Phone Number: 03/11/2021, 4:56 PM  Clinical Narrative:     Met with the patient  and his daughter in the room and discussed DC plan , He has DME at home including BSC, rolling walker, shower seat, grab bars, raised toilet He is agreeable to a bed search for STR SNF but not [facility]. PASSR Obtained, FL2 completed, Bedsearch sent       Expected Discharge Plan and Services                                                 Social Determinants of Health (SDOH) Interventions    Readmission Risk Interventions No flowsheet data found.

## 2021-03-11 NOTE — Discharge Instructions (Signed)

## 2021-03-11 NOTE — Progress Notes (Addendum)
Initial Nutrition Assessment  DOCUMENTATION CODES:  Non-severe (moderate) malnutrition in context of chronic illness  INTERVENTION:  Obtain admission weight.  Add Ensure Plus po BID, each supplement provides 350 kcal and 13 grams of protein.   Add Magic cup TID with meals, each supplement provides 290 kcal and 9 grams of protein.  Continue MVI with minerals.  NUTRITION DIAGNOSIS: Moderate Malnutrition related to chronic illness as evidenced by mild fat depletion, moderate fat depletion, mild muscle depletion.  GOAL:  Patient will meet greater than or equal to 90% of their needs  MONITOR:  PO intake, Supplement acceptance, Labs, Weight trends, I & O's, Skin  REASON FOR ASSESSMENT:  Consult Hip fracture protocol  ASSESSMENT:  75 yo male with a PMH of  hypertension, hyperlipidemia, BPH, skin cancer, C-spine degenerative disc disease, dCHF, who presents with a fall and right hip pain. Admitted with R femur fracture.  Pt asleep at bedside. Did not wake to touch or voice. Spoke with wife. Wife reports that pt does not eat very much at home.   She reports breakfast of meat, toast, and fruit; lunch of sandwich; dinner of meat and vegetables. He does occasionally snack, but not often. He eats approximately 50% of his meals or a smaller portion than this wife.  His wife denies any weight changes. She reports a UBW of 140-145 lbs and this has been consistent lately.   No weight for this admission. No recent weight history in Epic. Per Care Everywhere, pt weighed 65.8 kg on 01/10/21. RD to order new measured weight to determine weight loss.  On exam, pt with mild to moderate depletions.  Recommend adding Ensure Plus BID, Magic Cup TID, and continuing MVI with minerals.  Attached "Suggestions for Increasing Calories and Protein" handout from the Academy of Nutrition [employer] Dietetics to pt's discharge summary.  Medications: reviewed; colace BID, ferrous sulfate, folic acid, MVI with  minerals, thiamine, NaCl @ 75 ml/hr, Vicodin PRN (given once today)  Labs: reviewed; Na 129 (L), Glucose 128 (H)  NUTRITION - FOCUSED PHYSICAL EXAM: Flowsheet Row Most Recent Value  Orbital Region Mild depletion  Upper Arm Region Moderate depletion  Thoracic and Lumbar Region No depletion  Buccal Region Mild depletion  Temple Region Mild depletion  Clavicle Bone Region Mild depletion  Clavicle and Acromion Bone Region Mild depletion  Scapular Bone Region Unable to assess  Dorsal Hand Mild depletion  Patellar Region Mild depletion  Anterior Thigh Region Mild depletion  Posterior Calf Region Mild depletion  Edema (RD Assessment) Mild  [RLE]  Hair Reviewed  Eyes Unable to assess  Mouth Reviewed  Skin Reviewed  Nails Reviewed   Diet Order:   Diet Order             Diet full liquid Room service appropriate? Yes; Fluid consistency: Thin  Diet effective now                  EDUCATION NEEDS:  Education needs have been addressed  Skin:  Skin Assessment: Skin Integrity Issues: Skin Integrity Issues:: Incisions Incisions: R hip, closed  Last BM:  unknown  Height:  Ht Readings from Last 1 Encounters:  02/01/18 '5\' 7"'$  (1.702 m)   Weight:  Wt Readings from Last 1 Encounters:  02/01/18 68.9 kg   BMI:  There is no height or weight on file to calculate BMI.  Estimated Nutritional Needs:  Kcal:  1900-2100 Protein:  85-100 grams Fluid:  >1.9 L  Derrel Nip, RD, LDN (she/her/hers) Registered Dietitian  I After-Hours/Weekend Pager # in [place]

## 2021-03-11 NOTE — Progress Notes (Signed)
Physical Therapy Treatment Patient Details Name: Alan Brock MRN: [id number] DOB: [birth year] Today's Date: 03/11/2021    History of Present Illness Pt admitted for R hip fx s/p falling from ladder. Pt is s/p R hip hemi on 03/10/21. History includes HLD, HTN, fall, and alcohol abuse.    PT Comments    Pt is making gradual progress towards goals and continued to be limited by lethargy. Pt able to awaken slightly for OOB mobility, however quickly fatigues and becomes lethargic. Lunch tray infront of patient untouched. Able to participate with some there-ex. Reports slight dizziness with exertion. O2 sats at 91% on RA. +2 for all mobility at this time. Will continue to progress.  Follow Up Recommendations  SNF     Equipment Recommendations  Rolling walker with 5" wheels;3in1 (PT)    Recommendations for Other Services       Precautions / Restrictions Precautions Precautions: Posterior Hip;Fall Precaution Booklet Issued: Yes (comment) Restrictions Weight Bearing Restrictions: Yes RLE Weight Bearing: Partial weight bearing    Mobility  Bed Mobility Overal bed mobility: Needs Assistance Bed Mobility: Supine to Sit     Supine to sit: Mod assist;+2 for physical assistance     General bed mobility comments: becomes more alert with mobility efforts. Needs heavy cues for sequencing including hand placement and assist to maintain hip precautions. Once seated, tends to demonstrate L lateral lean, able to improve with cues    Transfers Overall transfer level: Needs assistance Equipment used: Rolling walker (2 wheeled) Transfers: Sit to/from Stand Sit to Stand: Mod assist;+2 safety/equipment;From elevated surface         General transfer comment: first attempt, unable to stand due to weakness. +2 obtained and able to stand from elevated surface. Performed pregait activities at bedside, unable to formally take steps at this time. Stood for extended time prior to fatigue with B  knees buckling. Assist with controlled decent back to bed  Ambulation/Gait             General Gait Details: not safe to perform   Stairs             Wheelchair Mobility    Modified Rankin (Stroke Patients Only)       Balance Overall balance assessment: Needs assistance Sitting-balance support: Feet supported Sitting balance-Leahy Scale: Fair Sitting balance - Comments: L lateral lean     Standing balance-Leahy Scale: Poor Standing balance comment: L post lateral lean                            Cognition Arousal/Alertness: Lethargic Behavior During Therapy: Flat affect Overall Cognitive Status: Impaired/Different from baseline                                 General Comments: very lethargic      Exercises Other Exercises Other Exercises: supine ther-ex performed on B LE including AP, LAQ, and hip abd/add. 5-10 reps performed. Attempted IS and QS, however not able to perform correctly at this time    General Comments        Pertinent Vitals/Pain Pain Assessment: Faces Faces Pain Scale: Hurts even more Pain Location: R LE Pain Descriptors / Indicators: Aching;Dull;Discomfort;Operative site guarding Pain Intervention(s): Limited activity within patient's tolerance;Repositioned    Home Living Family/patient expects to be discharged to:: Private residence Living Arrangements: Spouse/significant other Available Help at Discharge: Family Type of Home:  House Home Access: Stairs to enter Entrance Stairs-Rails:  (B wide rails) Home Layout: Two level;Able to live on main level with bedroom/bathroom Home Equipment: [name]      Prior Function Level of Independence: Independent      Comments: active, no falls prior   PT Goals (current goals can now be found in the care plan section) Acute Rehab PT Goals Patient Stated Goal: to get stronger PT Goal Formulation: With patient Time For Goal Achievement:  03/25/21 Potential to Achieve Goals: Good Progress towards PT goals: Progressing toward goals    Frequency    BID      PT Plan Current plan remains appropriate    Co-evaluation              AM-PAC PT "6 Clicks" Mobility   Outcome Measure  Help needed turning from your back to your side while in a flat bed without using bedrails?: A Lot Help needed moving from lying on your back to sitting on the side of a flat bed without using bedrails?: A Lot Help needed moving to and from a bed to a chair (including a wheelchair)?: Total Help needed standing up from a chair using your arms (e.g., wheelchair or bedside chair)?: Total Help needed to walk in hospital room?: Total Help needed climbing 3-5 steps with a railing? : Total 6 Click Score: 8    End of Session Equipment Utilized During Treatment: Gait belt Activity Tolerance: Patient limited by lethargy Patient left: in bed;with bed alarm set;with family/visitor present;with SCD's reapplied Nurse Communication: Mobility status PT Visit Diagnosis: Muscle weakness (generalized) (M62.81);History of falling (Z91.81);Difficulty in walking, not elsewhere classified (R26.2);Pain Pain - Right/Left: Right Pain - part of body: Hip     Time: [id number] PT Time Calculation (min) (ACUTE ONLY): 31 min  Charges:  $Therapeutic Exercise: 8-22 mins $Therapeutic Activity: 8-22 mins                     Greggory Stallion, PT, DPT [phone]    , 03/11/2021, 2:22 PM

## 2021-03-11 NOTE — NC FL2 (Signed)
[place] LEVEL OF CARE SCREENING TOOL     IDENTIFICATION  Patient Name: Alan Brock Birthdate: [birth year] Sex: male Admission Date (Current Location): 03/09/2021  [facility] and [place] Number:  Engineering geologist and Address:  [facility], [address] [place], [place] [address]      Provider Number: [id number]  Attending Physician Name and Address:  Wyvonnia Dusky, MD  Relative Name and Phone Number:  Remo Lipps [phone]    Current Level of Care: Hospital Recommended Level of Care: [place] Prior Approval Number:    Date Approved/Denied:   PASRR Number: [id number]  Discharge Plan: SNF    Current Diagnoses: Patient Active Problem List   Diagnosis Date Noted   Malnutrition of moderate degree 03/11/2021   Closed right hip fracture ([place]) 03/09/2021   Alcohol use 03/09/2021   Hyperlipidemia    Hypertension    Chronic diastolic CHF (congestive heart failure) ([place])    Fall at home, initial encounter    Closed displaced fracture of right femoral neck (H[facility])     Orientation RESPIRATION BLADDER Height & Weight     Self, Time, Situation, Place  Normal Continent, External catheter Weight:   Height:     BEHAVIORAL SYMPTOMS/MOOD NEUROLOGICAL BOWEL NUTRITION STATUS      Continent Diet (regular)  AMBULATORY STATUS COMMUNICATION OF NEEDS Skin   Extensive Assist Verbally Normal, Surgical wounds                       Personal Care Assistance Level of Assistance  Bathing, Dressing Bathing Assistance: Limited assistance   Dressing Assistance: Limited assistance     Functional Limitations Info             SPECIAL CARE FACTORS FREQUENCY  OT (By licensed OT), PT (By licensed PT)     PT Frequency: 5 times per week OT Frequency: 5 times per week            Contractures Contractures Info: Not present    Additional Factors Info  Code Status, Allergies Code Status Info: Full code Allergies  Info: NKDA           Current Medications (03/11/2021):  This is the current hospital active medication list Current Facility-Administered Medications  Medication Dose Route Frequency Provider Last Rate Last Admin   0.45 % sodium chloride infusion   Intravenous Continuous Earnestine Leys, MD 75 mL/hr at 03/11/21 0603 New Bag at 03/11/21 0603   acetaminophen (TYLENOL) tablet 325-650 mg  325-650 mg Oral Q6H PRN Earnestine Leys, MD       alum & mag hydroxide-simeth (MAALOX/MYLANTA) 200-200-20 MG/5ML suspension 30 mL  30 mL Oral Q4H PRN Earnestine Leys, MD       aspirin chewable tablet 81 mg  81 mg Oral Daily Earnestine Leys, MD   81 mg at 03/11/21 0919   bisacodyl (DULCOLAX) suppository 10 mg  10 mg Rectal Daily PRN Earnestine Leys, MD       chlorhexidine (HIBICLENS) 4 % liquid 1 application  1 application Topical Once Earnestine Leys, MD       docusate sodium (COLACE) capsule 100 mg  100 mg Oral BID Earnestine Leys, MD   100 mg at 03/11/21 0919   enoxaparin (LOVENOX) injection 30 mg  30 mg Subcutaneous Q24H Earnestine Leys, MD   30 mg at 03/11/21 0800   feeding supplement (ENSURE ENLIVE / ENSURE PLUS) liquid 237 mL  237 mL Oral BID BM Eppie Gibson M,  MD   237 mL at 03/11/21 1312   ferrous sulfate tablet 325 mg  325 mg Oral Q breakfast Earnestine Leys, MD   325 mg at [id number]   folic acid (FOLVITE) tablet 1 mg  1 mg Oral Daily Earnestine Leys, MD   1 mg at 03/11/21 0919   gabapentin (NEURONTIN) capsule 300 mg  300 mg Oral QHS Earnestine Leys, MD   300 mg at 03/10/21 2243   hydrALAZINE (APRESOLINE) injection 5 mg  5 mg Intravenous Q2H PRN Earnestine Leys, MD       HYDROcodone-acetaminophen [facility]) 7.5-325 MG per tablet 1-2 tablet  1-2 tablet Oral Q4H PRN Earnestine Leys, MD   2 tablet at 03/11/21 [id number]   menthol-cetylpyridinium (CEPACOL) lozenge 3 mg  1 lozenge Oral PRN Earnestine Leys, MD       Or   phenol (CHLORASEPTIC) mouth spray 1 spray  1 spray Mouth/Throat PRN Earnestine Leys, MD        methocarbamol (ROBAXIN) tablet 500 mg  500 mg Oral Q6H PRN Earnestine Leys, MD       Or   methocarbamol (ROBAXIN) 500 mg in dextrose 5 % 50 mL IVPB  500 mg Intravenous Q6H PRN Earnestine Leys, MD       metoCLOPramide (REGLAN) tablet 5-10 mg  5-10 mg Oral Q8H PRN Earnestine Leys, MD       Or   metoCLOPramide (REGLAN) injection 5-10 mg  5-10 mg Intravenous Q8H PRN Earnestine Leys, MD       multivitamin with minerals tablet 1 tablet  1 tablet Oral Daily Earnestine Leys, MD   1 tablet at 03/11/21 0919   ondansetron [facility]) tablet 4 mg  4 mg Oral Q6H PRN Earnestine Leys, MD       Or   ondansetron [facility]) injection 4 mg  4 mg Intravenous Q6H PRN Earnestine Leys, MD       senna-docusate (Senokot-S) tablet 1 tablet  1 tablet Oral QHS PRN Earnestine Leys, MD       sodium phosphate (FLEET) 7-19 GM/118ML enema 1 enema  1 enema Rectal Once PRN Earnestine Leys, MD       thiamine tablet 100 mg  100 mg Oral Daily Earnestine Leys, MD   100 mg at 03/11/21 [id number]   Or   thiamine (B-1) injection 100 mg  100 mg Intravenous Daily Earnestine Leys, MD       traMADol Veatrice Bourbon) tablet 50 mg  50 mg Oral Q6H PRN Earnestine Leys, MD       zolpidem Lorrin Mais) tablet 5 mg  5 mg Oral QHS PRN Earnestine Leys, MD         Discharge Medications: Please see discharge summary for a list of discharge medications.  Relevant Imaging Results:  Relevant Lab Results:   Additional Information SS# [id number]  Su Hilt, RN

## 2021-03-11 NOTE — Progress Notes (Signed)
PROGRESS NOTE    Alan Brock  [id number] DOB: [birth year] DOA: 03/09/2021 PCP: Idelle Crouch, MD   Assessment & Plan:   Principal Problem:   Closed displaced fracture of right femoral neck ([place]) Active Problems:   Hyperlipidemia   Hypertension   Chronic diastolic CHF (congestive heart failure) ([place])   Fall at home, initial encounter   Alcohol use   Closed displaced fracture of right femoral neck: likely secondary to fall at home. Morphine, percocet prn for pain. S/p right hip hemiarthroplasty 8/28 as per othro surg. PT recs SNF  Thrombocytopenia: etiology unclear, will continue to monitor   HLD: not on a statin currently    HTN: not on any anti-HTN meds    Chronic diastolic CHF: echo [id number] showed EF > [id number] 2 diastolic dysfunction. Appears euvolemic. Not taking CHF meds currently    Alcohol use: drinks 2 beers daily. No signs of w/drawal, so will d/c CIWA protocol   Moderate malnutrition: as per nutrition. Continue on nutritional supplements   DVT prophylaxis: lovenox  Code Status: full  Family Communication:  Disposition Plan: likely d/c to SNF  Level of care: Med-Surg  Status is: Inpatient  Remains inpatient appropriate because:Unsafe d/c plan, IV treatments appropriate due to intensity of illness or inability to take PO, and Inpatient level of care appropriate due to severity of illness  Dispo: The patient is from: Home              Anticipated d/c is to: SNF              Patient currently is not medically stable to d/c.   Difficult to place patient : unclear   Consultants:    Procedures:   Antimicrobials:    Subjective: Pt c/o hip pain still  Objective: Vitals:   03/10/21 1206 03/10/21 1457 03/10/21 2025 03/11/21 0552  BP: 105/76 135/70 129/87 110/62  Pulse: 88 92 (!) 104 (!) 109  Resp: '16 16  18  '$ Temp: 97.7 F (36.5 C) 98.4 F (36.9 C) 99.2 F (37.3 C) 97.9 F (36.6 C)  TempSrc:    Oral  SpO2: (!) 25% 100% 99% 100%     Intake/Output Summary (Last 24 hours) at 03/11/2021 0743 Last data filed at 03/11/2021 0300 Gross per 24 hour  Intake 2466.81 ml  Output 1600 ml  Net 866.81 ml   There were no vitals filed for this visit.  Examination:  General exam: Appears lethargic Respiratory system: diminished breath sounds b/l otherwise clear Cardiovascular system: S1/S2+. No rubs or clicks  Gastrointestinal system: Abd is soft, NT, ND & hypoactive bowel sounds Central nervous system: Lethargic. Moves all extremities  Psychiatry: Judgement and insight appear normal. Flat mood and affect    Data Reviewed: I have personally reviewed following labs and imaging studies  CBC: Recent Labs  Lab 03/09/21 1208 03/10/21 0505 03/10/21 1304 03/11/21 0445  WBC 5.4 7.5 10.8* 10.3  NEUTROABS 3.5  --   --   --   HGB 13.6 13.5 13.1 11.8*  HCT 38.5* 37.8* 37.6* 34.9*  MCV 92.3 92.9 94.2 93.3  PLT 167 159 167 [id number]*   Basic Metabolic Panel: Recent Labs  Lab 03/09/21 1208 03/10/21 1304 03/11/21 0445  NA 139  --  129*  K 3.7  --  3.6  CL 102  --  95*  CO2 28  --  26  GLUCOSE 108*  --  128*  BUN 19  --  11  CREATININE 1.03 0.87 0.75  CALCIUM  9.1  --  8.1*   GFR: CrCl cannot be calculated (Unknown ideal weight.). Liver Function Tests: No results for input(s): AST, ALT, ALKPHOS, BILITOT, PROT, ALBUMIN in the last 168 hours. No results for input(s): LIPASE, AMYLASE in the last 168 hours. No results for input(s): AMMONIA in the last 168 hours. Coagulation Profile: Recent Labs  Lab 03/09/21 1208  INR 1.0   Cardiac Enzymes: No results for input(s): CKTOTAL, CKMB, CKMBINDEX, TROPONINI in the last 168 hours. BNP (last 3 results) No results for input(s): PROBNP in the last 8760 hours. HbA1C: No results for input(s): HGBA1C in the last 72 hours. CBG: No results for input(s): GLUCAP in the last 168 hours. Lipid Profile: No results for input(s): CHOL, HDL, LDLCALC, TRIG, CHOLHDL, LDLDIRECT in the last 72  hours. Thyroid Function Tests: No results for input(s): TSH, T4TOTAL, FREET4, T3FREE, THYROIDAB in the last 72 hours. Anemia Panel: No results for input(s): VITAMINB12, FOLATE, FERRITIN, TIBC, IRON, RETICCTPCT in the last 72 hours. Sepsis Labs: No results for input(s): PROCALCITON, LATICACIDVEN in the last 168 hours.  Recent Results (from the past 240 hour(s))  Resp Panel by RT-PCR (Flu A&B, Covid) Nasopharyngeal Swab     Status: None   Collection Time: 03/09/21 12:15 PM   Specimen: Nasopharyngeal Swab; Nasopharyngeal(NP) swabs in vial transport medium  Result Value Ref Range Status   SARS Coronavirus 2 by RT PCR NEGATIVE NEGATIVE Final    Comment: (NOTE) SARS-CoV-2 target nucleic acids are NOT DETECTED.  The SARS-CoV-2 RNA is generally detectable in upper respiratory specimens during the acute phase of infection. The lowest concentration of SARS-CoV-2 viral copies this assay can detect is 138 copies/mL. A negative result does not preclude SARS-Cov-2 infection and should not be used as the sole basis for treatment or other patient management decisions. A negative result may occur with  improper specimen collection/handling, submission of specimen other than nasopharyngeal swab, presence of viral mutation(s) within the areas targeted by this assay, and inadequate number of viral copies(<138 copies/mL). A negative result must be combined with clinical observations, patient history, and epidemiological information. The expected result is Negative.  Fact Sheet for Patients:  EntrepreneurPulse.com.au  Fact Sheet for Healthcare Providers:  IncredibleEmployment.be  This test is no t yet approved or cleared by the [place] FDA and  has been authorized for detection and/or diagnosis of SARS-CoV-2 by FDA under an Emergency Use Authorization (EUA). This EUA will remain  in effect (meaning this test can be used) for the duration of the COVID-19  declaration under Section 564(b)(1) of the Act, 21 U.S.C.section 360bbb-3(b)(1), unless the authorization is terminated  or revoked sooner.       Influenza A by PCR NEGATIVE NEGATIVE Final   Influenza B by PCR NEGATIVE NEGATIVE Final    Comment: (NOTE) The Xpert Xpress SARS-CoV-2/FLU/RSV plus assay is intended as an aid in the diagnosis of influenza from Nasopharyngeal swab specimens and should not be used as a sole basis for treatment. Nasal washings and aspirates are unacceptable for Xpert Xpress SARS-CoV-2/FLU/RSV testing.  Fact Sheet for Patients: EntrepreneurPulse.com.au  Fact Sheet for Healthcare Providers: IncredibleEmployment.be  This test is not yet approved or cleared by the [place] FDA and has been authorized for detection and/or diagnosis of SARS-CoV-2 by FDA under an Emergency Use Authorization (EUA). This EUA will remain in effect (meaning this test can be used) for the duration of the COVID-19 declaration under Section 564(b)(1) of the Act, 21 U.S.C. section 360bbb-3(b)(1), unless the authorization is terminated or  revoked.  Performed at [facility], [address], [place], [place] [address]   Surgical pcr screen     Status: None   Collection Time: 03/10/21  1:45 AM   Specimen: Nasal Mucosa; Nasal Swab  Result Value Ref Range Status   MRSA, PCR NEGATIVE NEGATIVE Final   Staphylococcus aureus NEGATIVE NEGATIVE Final    Comment: (NOTE) The Xpert SA Assay (FDA approved for NASAL specimens in patients 10 years of age and older), is one component of a comprehensive surveillance program. It is not intended to diagnose infection nor to guide or monitor treatment. Performed at [facility], [address] [place], [place] [address]          Radiology Studies: DG Chest Portable 1 View  Result Date: 03/09/2021 CLINICAL DATA:  Right hip fracture. EXAM: PORTABLE CHEST 1 VIEW COMPARISON:  None.  FINDINGS: The heart size and mediastinal contours are within normal limits. Both lungs are clear. The visualized skeletal structures are unremarkable. IMPRESSION: No active disease. Aortic Atherosclerosis (ICD10-I70.0). Electronically Signed   By: Marijo Conception M.D.   On: 03/09/2021 12:42   DG Hip Port Unilat With Pelvis 1V Right  Result Date: 03/10/2021 CLINICAL DATA:  Postop EXAM: DG HIP (WITH OR WITHOUT PELVIS) 1V PORT RIGHT COMPARISON:  March 09, 2021 FINDINGS: Status post RIGHT hip arthroplasty. Orthopedic hardware is intact and without periprosthetic fracture or lucency. Osteopenia. Surgical staples and subcutaneous air. IMPRESSION: Expected postsurgical appearance status post RIGHT hip arthroplasty. Electronically Signed   By: Valentino Saxon M.D.   On: 03/10/2021 11:52   DG Hip Unilat W or Wo Pelvis 2-3 Views Right  Result Date: 03/09/2021 CLINICAL DATA:  Right hip pain after fall. EXAM: DG HIP (WITH OR WITHOUT PELVIS) 2-3V RIGHT COMPARISON:  None. FINDINGS: Moderately displaced proximal right femoral neck fracture is noted. IMPRESSION: Moderately displaced proximal right femoral neck fracture. Electronically Signed   By: Marijo Conception M.D.   On: 03/09/2021 12:41        Scheduled Meds:  aspirin  81 mg Oral Daily   chlorhexidine  1 application Topical Once   docusate sodium  100 mg Oral BID   enoxaparin (LOVENOX) injection  30 mg Subcutaneous Q24H   ferrous sulfate  325 mg Oral Q breakfast   folic acid  1 mg Oral Daily   gabapentin  300 mg Oral QHS   LORazepam  0-4 mg Oral Q12H   multivitamin with minerals  1 tablet Oral Daily   thiamine  100 mg Oral Daily   Or   thiamine  100 mg Intravenous Daily   Continuous Infusions:  sodium chloride 75 mL/hr at 03/11/21 0603    ceFAZolin (ANCEF) IV 2 g (03/11/21 0139)   clindamycin (CLEOCIN) IV 600 mg (03/11/21 0219)   methocarbamol (ROBAXIN) IV       LOS: 2 days    Time spent: 31 mins     Wyvonnia Dusky, MD [facility] Pager [phone]  If 7PM-7AM, please contact night-coverage 03/11/2021, 7:43 AM

## 2021-03-12 DIAGNOSIS — E440 Moderate protein-calorie malnutrition: Secondary | ICD-10-CM

## 2021-03-12 LAB — CBC
HCT: 32.3 % — ABNORMAL LOW (ref 39.0–52.0)
Hemoglobin: 11.2 g/dL — ABNORMAL LOW (ref 13.0–17.0)
MCH: 31.4 pg (ref 26.0–34.0)
MCHC: 34.7 g/dL (ref 30.0–36.0)
MCV: 90.5 fL (ref 80.0–100.0)
Platelets: 147 10*3/uL — ABNORMAL LOW (ref 150–400)
RBC: 3.57 MIL/uL — ABNORMAL LOW (ref 4.22–5.81)
RDW: 12 % (ref 11.5–15.5)
WBC: 10.4 10*3/uL (ref 4.0–10.5)
nRBC: 0 % (ref 0.0–0.2)

## 2021-03-12 LAB — BASIC METABOLIC PANEL
Anion gap: 7 (ref 5–15)
BUN: 10 mg/dL (ref 8–23)
CO2: 28 mmol/L (ref 22–32)
Calcium: 8.3 mg/dL — ABNORMAL LOW (ref 8.9–10.3)
Chloride: 95 mmol/L — ABNORMAL LOW (ref 98–111)
Creatinine, Ser: 0.82 mg/dL (ref 0.61–1.24)
GFR, Estimated: >60 mL/min (ref >60)
Glucose, Bld: 113 mg/dL — ABNORMAL HIGH (ref 70–99)
Potassium: 4.2 mmol/L (ref 3.5–5.1)
Sodium: 130 mmol/L — ABNORMAL LOW (ref 135–145)

## 2021-03-12 LAB — SURGICAL PATHOLOGY

## 2021-03-12 NOTE — Progress Notes (Signed)
Subjective: 2 Days Post-Op Procedure(s) (LRB): ARTHROPLASTY BIPOLAR HIP (HEMIARTHROPLASTY) (Right) Patient is doing very well.  Is alert and cooperative and cognizant.  Right hip there is stable and minimal pain with movement.  Dressings to be changed today.  We will start PT today.  He should be able to go home.  Patient reports pain as mild.  Objective:   VITALS:   Vitals:   03/12/21 0559 03/12/21 0748  BP: 134/67 124/64  Pulse: 97 96  Resp: 18 18  Temp: 98.4 F (36.9 C) 98.6 F (37 C)  SpO2: 100% 100%    Neurologically intact  LABS Recent Labs    03/10/21 1304 03/11/21 0445 03/12/21 0543  HGB 13.1 11.8* 11.2*  HCT 37.6* 34.9* 32.3*  WBC 10.8* 10.3 10.4  PLT 167 147* 147*    Recent Labs    03/09/21 1208 03/10/21 1304 03/11/21 0445 03/12/21 0543  NA 139  --  129* 130*  K 3.7  --  3.6 4.2  BUN 19  --  11 10  CREATININE 1.03 0.87 0.75 0.82  GLUCOSE 108*  --  128* 113*    Recent Labs    03/09/21 1208  INR 1.0     Assessment/Plan: 2 Days Post-Op Procedure(s) (LRB): ARTHROPLASTY BIPOLAR HIP (HEMIARTHROPLASTY) (Right)   Advance diet Up with therapy D/C IV fluids Discharge home with home health  Enteric-coated aspirin 81 mg twice daily on discharge  Partial weightbearing right leg with walker  Return to clinic 2 weeks for exam and x-ray

## 2021-03-12 NOTE — Progress Notes (Addendum)
Physical Therapy Treatment Patient Details Name: Alan Brock MRN: [id number] DOB: Dec 01, 1945 Today's Date: 03/12/2021    History of Present Illness Pt admitted for R hip fx s/p falling from ladder. Pt is s/p R hip hemi on 03/10/21. History includes HLD, HTN, fall, and alcohol abuse.    PT Comments    Pt is making gradual progress towards goals, however continues to be limited by pain. Pain meds given prior to session. More alert this date, takes increased cues for participation. Attempted OOB including standing with RW. Heavy assist for trial, unable to fully stand. Returned to bed. Educated on hip precautions. Able to recall 1/3 at end of session. Will continue attempts as able.   Follow Up Recommendations  SNF     Equipment Recommendations  Rolling walker with 5" wheels;3in1 (PT)    Recommendations for Other Services       Precautions / Restrictions Precautions Precautions: Posterior Hip;Fall Precaution Booklet Issued: Yes (comment) Restrictions Weight Bearing Restrictions: Yes RLE Weight Bearing: Partial weight bearing    Mobility  Bed Mobility Overal bed mobility: Needs Assistance Bed Mobility: Supine to Sit     Supine to sit: Mod assist;Max assist;+2 for physical assistance     General bed mobility comments: follows commands inconsistently. Very fearful of pain. Heavy assist required for trunk elevation and B LE movement. +2 assist.    Transfers Overall transfer level: Needs assistance Equipment used: Rolling walker (2 wheeled) Transfers: Sit to/from Stand Sit to Stand: Max assist;+2 physical assistance         General transfer comment: partial stand performed x 2 reps. Keeps B knees in flexed position with inability to demonstrate full extension. Antalgic.  Ambulation/Gait             General Gait Details: not safe to perform   Stairs             Wheelchair Mobility    Modified Rankin (Stroke Patients Only)       Balance Overall  balance assessment: Needs assistance Sitting-balance support: Feet supported Sitting balance-Leahy Scale: Fair Sitting balance - Comments: post resistance     Standing balance-Leahy Scale: Poor                              Cognition Arousal/Alertness: Awake/alert Behavior During Therapy: WFL for tasks assessed/performed Overall Cognitive Status: Within Functional Limits for tasks assessed                                 General Comments: more alert this date, painful      Exercises Other Exercises Other Exercises: supine ther-ex performed on R LE including AP, QS, glut sets, B hip abd/add, R heel slides. 10 reps performed with max assist on R LE    General Comments        Pertinent Vitals/Pain Pain Assessment: Faces Faces Pain Scale: Hurts whole lot Pain Location: R LE Pain Descriptors / Indicators: Aching;Dull;Discomfort;Operative site guarding Pain Intervention(s): Limited activity within patient's tolerance;Premedicated before session;Repositioned;Utilized relaxation techniques    Home Living                      Prior Function            PT Goals (current goals can now be found in the care plan section) Acute Rehab PT Goals Patient Stated Goal: to get stronger  PT Goal Formulation: With patient Time For Goal Achievement: 03/25/21 Potential to Achieve Goals: Good Progress towards PT goals: Progressing toward goals    Frequency    BID      PT Plan Current plan remains appropriate    Co-evaluation              AM-PAC PT "6 Clicks" Mobility   Outcome Measure  Help needed turning from your back to your side while in a flat bed without using bedrails?: A Lot Help needed moving from lying on your back to sitting on the side of a flat bed without using bedrails?: A Lot Help needed moving to and from a bed to a chair (including a wheelchair)?: Total Help needed standing up from a chair using your arms (e.g.,  wheelchair or bedside chair)?: Total Help needed to walk in hospital room?: Total Help needed climbing 3-5 steps with a railing? : Total 6 Click Score: 8    End of Session Equipment Utilized During Treatment: Gait belt Activity Tolerance: Patient limited by pain Patient left: in bed;with bed alarm set;with family/visitor present;with SCD's reapplied Nurse Communication: Mobility status PT Visit Diagnosis: Muscle weakness (generalized) (M62.81);History of falling (Z91.81);Difficulty in walking, not elsewhere classified (R26.2);Pain Pain - Right/Left: Right Pain - part of body: Hip     Time: [id number] PT Time Calculation (min) (ACUTE ONLY): 30 min  Charges:  $Therapeutic Exercise: 8-22 mins $Therapeutic Activity: 8-22 mins                     Greggory Stallion, PT, DPT [phone]    , 03/12/2021, 10:44 AM

## 2021-03-12 NOTE — Progress Notes (Signed)
Physical Therapy Treatment Patient Details Name: Alan Brock MRN: [place]:[id number] DOB: [birth year] Today's Date: 03/12/2021    History of Present Illness Pt admitted for R hip fx s/p falling from ladder. Pt is s/p R hip hemiarthroplasty with posterior hip precautions on 03/10/21. History includes HLD, HTN, fall, and alcohol abuse.    PT Comments    Pt is making gradual progress towards goals with ability to increase standing tolerance this date using RW. +2 assist still required and pt able to side step towards HOB. +2 assist required for repositioning. Able to recall 2/3 hip precautions. Heavy cues given for proper WBing. Will continue to progress as able.   Follow Up Recommendations  SNF     Equipment Recommendations       Recommendations for Other Services       Precautions / Restrictions Precautions Precautions: Posterior Hip;Fall Precaution Booklet Issued: Yes (comment) Restrictions Weight Bearing Restrictions: Yes RLE Weight Bearing: Partial weight bearing    Mobility  Bed Mobility Overal bed mobility: Needs Assistance Bed Mobility: Supine to Sit     Supine to sit: Mod assist;+2 for physical assistance     General bed mobility comments: needed assist for sliding B LE across bed and trunkal elevation. Still requires +2 assist this date. ONce seated able to sit with decrease assist    Transfers Overall transfer level: Needs assistance Equipment used: Rolling walker (2 wheeled) Transfers: Sit to/from Stand Sit to Stand: Mod assist;Max assist;+2 physical assistance;From elevated surface         General transfer comment: able to stand with upright posture. Heavy cues for sequencing. RW used. Quickly fatigues with exertion  Ambulation/Gait Ambulation/Gait assistance: Mod assist Gait Distance (Feet): 3 Feet Assistive device: Rolling walker (2 wheeled) Gait Pattern/deviations: Step-to pattern     General Gait Details: low foot clearance noted with small side  steps towards HOB. Heavy cues for sequencing. Fatigues quickly   Marine scientist Rankin (Stroke Patients Only)       Balance Overall balance assessment: Needs assistance Sitting-balance support: Feet supported Sitting balance-Leahy Scale: Fair     Standing balance support: Bilateral upper extremity supported Standing balance-Leahy Scale: Fair                              Cognition Arousal/Alertness: Awake/alert Behavior During Therapy: WFL for tasks assessed/performed Overall Cognitive Status: Within Functional Limits for tasks assessed                                 General Comments: Pt was alert at times but then would drift off during conversation and instructions.      Exercises Other Exercises Other Exercises: supine ther-ex performed on R LE including SAQ, QS, glut sets, and attempted heel slides. 10 reps performed. Also performed IS x 10 reps at 61m.    General Comments        Pertinent Vitals/Pain Pain Assessment: 0-10 Pain Score: 4  Faces Pain Scale: Hurts whole lot Pain Location: R LE Pain Descriptors / Indicators: Aching;Dull;Discomfort;Operative site guarding Pain Intervention(s): Limited activity within patient's tolerance;Repositioned    Home Living Family/patient expects to be discharged to:: Private residence Living Arrangements: Spouse/significant other Available Help at Discharge: Family Type of Home: House Home Access: Stairs to enter   Home Layout:  Two level;Able to live on main level with bedroom/bathroom Home Equipment: [name];Walker - 2 wheels;Bedside commode;Grab bars - tub/shower      Prior Function Level of Independence: Independent      Comments: active, no falls prior   PT Goals (current goals can now be found in the care plan section) Acute Rehab PT Goals Patient Stated Goal: to be as independent as possible PT Goal Formulation: With  patient Time For Goal Achievement: 03/25/21 Potential to Achieve Goals: Good Progress towards PT goals: Progressing toward goals    Frequency    BID      PT Plan Current plan remains appropriate    Co-evaluation              AM-PAC PT "6 Clicks" Mobility   Outcome Measure  Help needed turning from your back to your side while in a flat bed without using bedrails?: A Lot Help needed moving from lying on your back to sitting on the side of a flat bed without using bedrails?: A Lot Help needed moving to and from a bed to a chair (including a wheelchair)?: A Lot Help needed standing up from a chair using your arms (e.g., wheelchair or bedside chair)?: A Lot Help needed to walk in hospital room?: Total Help needed climbing 3-5 steps with a railing? : Total 6 Click Score: 10    End of Session Equipment Utilized During Treatment: Gait belt Activity Tolerance: Patient limited by pain Patient left: in bed;with bed alarm set;with family/visitor present;with SCD's reapplied Nurse Communication: Mobility status PT Visit Diagnosis: Muscle weakness (generalized) (M62.81);History of falling (Z91.81);Difficulty in walking, not elsewhere classified (R26.2);Pain Pain - Right/Left: Right Pain - part of body: Hip     Time: 1329-1401 PT Time Calculation (min) (ACUTE ONLY): 32 min  Charges:  $Gait Training: 8-22 mins $Therapeutic Exercise: 8-22 mins                     Greggory Stallion, PT, DPT [phone]    , 03/12/2021, 2:38 PM

## 2021-03-12 NOTE — Evaluation (Signed)
Occupational Therapy Evaluation Patient Details Name: Alan Brock MRN: :[id number] DOB: 1945-09-17 Today's Date: 03/12/2021    History of Present Illness Pt admitted for R hip fx s/p falling from ladder. Pt is s/p R hip hemiarthroplasty with posterior hip precautions on 03/10/21. History includes HLD, HTN, fall, and alcohol abuse.   Clinical Impression   Pt seen for OT evaluation this date, daughter present during session.  Pt fell off ladder at home resulting in a right hip fracture s/p right hemiarthroplasty with posterior hip precautions.  Pt lives with wife in a 2 story home but can stay on 1st level for all his needs, he was independent with all ADL and IADL tasks prior to admission. Pt presents with increased pain, muscle weakness, decreased transfers, functional mobility and decreased ability to perform self care tasks. Pt would benefit from skilled OT services to maximize safety and independence in necessary daily tasks.  Recommend STR based upon pts current level of function prior to returning home.      Follow Up Recommendations       Equipment Recommendations       Recommendations for Other Services       Precautions / Restrictions Precautions Precautions: Posterior Hip;Fall Precaution Booklet Issued: Yes (comment) Restrictions Weight Bearing Restrictions: Yes RLE Weight Bearing: Partial weight bearing      Mobility Bed Mobility Overal bed mobility: Needs Assistance Bed Mobility: Supine to Sit     Supine to sit: Mod assist;Max assist     General bed mobility comments: increased pain with transfer, difficulty with maintaining sitting, unable to stand and will need +2    Transfers Overall transfer level: Needs assistance Equipment used: Rolling walker (2 wheeled) Transfers: Sit to/from Stand Sit to Stand: Max assist;+2 physical assistance         General transfer comment: partial stand performed x 2 reps. Keeps B knees in flexed position with inability to  demonstrate full extension. Antalgic.    Balance Overall balance assessment: Needs assistance Sitting-balance support: Feet supported Sitting balance-Leahy Scale: Poor Sitting balance - Comments: post resistance     Standing balance-Leahy Scale: Poor                             ADL either performed or assessed with clinical judgement   ADL Overall ADL's : Needs assistance/impaired Eating/Feeding: Set up   Grooming: Set up;Bed level Grooming Details (indicate cue type and reason): Unable to sit edge of bed or stand at eval Upper Body Bathing: Bed level;Set up   Lower Body Bathing: Set up;Total assistance   Upper Body Dressing : Set up;Minimal assistance   Lower Body Dressing: Total assistance;+2 for physical assistance   Toilet Transfer: +2 for physical assistance Toilet Transfer Details (indicate cue type and reason): Pain limiting transfer and difficulty maintaining a sitting position                 Vision Baseline Vision/History: 1 Wears glasses       Perception     Praxis      Pertinent Vitals/Pain Pain Assessment: 0-10 Pain Score: 7  Faces Pain Scale: Hurts whole lot Pain Location: R LE Pain Descriptors / Indicators: Aching;Dull;Discomfort;Operative site guarding Pain Intervention(s): Limited activity within patient's tolerance;Monitored during session;Repositioned     Hand Dominance Right   Extremity/Trunk Assessment Upper Extremity Assessment Upper Extremity Assessment: Generalized weakness   Lower Extremity Assessment Lower Extremity Assessment: Defer to PT evaluation  Cervical / Trunk Assessment Cervical / Trunk Assessment: Normal   Communication Communication Communication: No difficulties   Cognition Arousal/Alertness: Lethargic Behavior During Therapy: WFL for tasks assessed/performed Overall Cognitive Status: Within Functional Limits for tasks assessed                                 General Comments: Pt  was alert at times but then would drift off during conversation and instructions.   General Comments   Pt demonstrating difficulty with maintaining a seated position this date EOB and could not participate actively in lower body dressing training.  Educated on posterior hip precautions and implications into ADL routine.  He anticipates his wife will likely assist with dressing skills but she will not be able to help with transfers at his current level of skill. Pt able to complete grooming from bed level but lethargic at times this date.     Exercises Other Exercises Other Exercises: supine ther-ex performed on R LE including AP, QS, glut sets, B hip abd/add, R heel slides. 10 reps performed with max assist on R LE   Shoulder Instructions      Home Living Family/patient expects to be discharged to:: Private residence Living Arrangements: Spouse/significant other Available Help at Discharge: Family Type of Home: House Home Access: Stairs to enter [employer] of Steps: 4   Home Layout: Two level;Able to live on main level with bedroom/bathroom     Bathroom Shower/Tub: Occupational psychologist: Handicapped height Bathroom Accessibility: Yes How Accessible: Accessible via walker Home Equipment: [place] - single point;Walker - 2 wheels;Bedside commode;Grab bars - tub/shower          Prior Functioning/Environment Level of Independence: Independent        Comments: active, no falls prior        OT Problem List: Decreased strength;Decreased knowledge of use of DME or AE;Decreased range of motion;Decreased knowledge of precautions;Decreased activity tolerance;Impaired balance (sitting and/or standing);Pain      OT Treatment/Interventions: Self-care/ADL training;Therapeutic exercise;Patient/family education;Balance training;Therapeutic activities;DME and/or AE instruction    OT Goals(Current goals can be found in the care plan section) Acute Rehab OT Goals Patient  Stated Goal: to be as independent as possible OT Goal Formulation: With patient Time For Goal Achievement: 03/26/21 Potential to Achieve Goals: Good ADL Goals Pt Will Perform Lower Body Dressing: with adaptive equipment;with min assist Pt Will Transfer to Toilet: with min assist;bedside commode  OT Frequency: Min 2X/week   Barriers to D/C:            Co-evaluation              AM-PAC OT "6 Clicks" Daily Activity     Outcome Measure Help from another person eating meals?: None Help from another person taking care of personal grooming?: A Little Help from another person toileting, which includes using toliet, bedpan, or urinal?: Total Help from another person bathing (including washing, rinsing, drying)?: A Lot Help from another person to put on and taking off regular upper body clothing?: A Little Help from another person to put on and taking off regular lower body clothing?: Total 6 Click Score: 14   End of Session    Activity Tolerance: Patient limited by pain Patient left: in bed;with call bell/phone within reach;with bed alarm set;with family/visitor present  OT Visit Diagnosis: Muscle weakness (generalized) (M62.81);Unsteadiness on feet (R26.81);Pain Pain - Right/Left: Right Pain - part of body:  Hip                Time: [id number] OT Time Calculation (min): 26 min Charges:  OT General Charges $OT Visit: 1 Visit OT Evaluation $OT Eval Low Complexity: 1 Low OT Treatments $Self Care/Home Management : 8-22 mins   T , OTR/L, CLT   , 03/12/2021, 11:25 AM

## 2021-03-12 NOTE — Progress Notes (Signed)
PROGRESS NOTE    HPI was taken from Dr. Blaine Hamper: Alan Brock is a 75 y.o. male with medical history significant of hypertension, hyperlipidemia, BPH, skin cancer, C-spine degenerative disc disease, dCHF, who presents with a fall and right hip pain.   Patient states that she accidentally fall off the ladder about 5 feet high at 11:30 AM.  No loss of consciousness.  Patient is 100% sure that he did not injure his head or neck.  Patient does not headache.  Patient has chronic neck pain which has not changed.  Patient has developed severe pain in the right hip, which is sharp, constant, severe, 10 out of 10 in severity, nonradiating. The right leg is shortened and externally rotated.  No nausea vomiting, diarrhea or abdominal pain.  Patient denies any chest pain, cough, shortness of breath.  No fever or chills.  No symptoms of UTI.  Patient states that she drinks 2 beers every day.  Alan Brock  [id number] DOB: October 07, 1945 DOA: 03/09/2021 PCP: Idelle Crouch, MD   Assessment & Plan:   Principal Problem:   Closed displaced fracture of right femoral neck ([place]) Active Problems:   Hyperlipidemia   Hypertension   Chronic diastolic CHF (congestive heart failure) ([place])   Fall at home, initial encounter   Alcohol use   Malnutrition of moderate degree   Closed displaced fracture of right femoral neck: likely secondary to fall at home. Morphine, percocet prn for pain. S/p right hip hemiarthroplasty 8/28 as per othro surg. PT recs SNF. DVT prophylaxis as per ortho surg. Waiting on SNF placement   Thrombocytopenia: stable from day prior. Etiology unclear   HLD: not on a statin currently    HTN: not on any anti-HTN meds    Chronic diastolic CHF: echo [id number] showed EF > 123XX123 2 diastolic dysfunction. Appears euvolemic. Not taking CHF meds currently    Alcohol use: drinks 2 beers daily. No signs of w/drawal  Moderate malnutrition: as per nutrition. Continue on nutritional supplements    DVT prophylaxis: lovenox  Code Status: full  Family Communication:  Disposition Plan: likely d/c to SNF; waiting on SNF placement   Level of care: Med-Surg  Status is: Inpatient  Remains inpatient appropriate because:Unsafe d/c plan, IV treatments appropriate due to intensity of illness or inability to take PO, and Inpatient level of care appropriate due to severity of illness  Dispo: The patient is from: Home              Anticipated d/c is to: SNF              Patient currently is not medically stable to d/c.   Difficult to place patient : unclear   Consultants:    Procedures:   Antimicrobials:    Subjective: Pt c/o hip pain still  Objective: Vitals:   03/11/21 2017 03/11/21 2050 03/12/21 0559 03/12/21 0748  BP: (!) 155/66  134/67 124/64  Pulse: (!) 111 82 97 96  Resp: '18  18 18  '$ Temp: 99 F (37.2 C)  98.4 F (36.9 C) 98.6 F (37 C)  TempSrc:      SpO2: 98% 98% 100% 100%    Intake/Output Summary (Last 24 hours) at 03/12/2021 0807 Last data filed at 03/12/2021 0500 Gross per 24 hour  Intake --  Output 500 ml  Net -500 ml   There were no vitals filed for this visit.  Examination:  General exam: Appears calm & comfortable  Respiratory system: decreased breath sounds b/l  Cardiovascular system: S1 & S2+. No rubs or clicks  Gastrointestinal system: Abd is soft, NT, ND & hypoactive bowel sounds Central nervous system: Awake and oriented. Moves all extremities  Psychiatry: Judgement and insight appear normal. Flat mood and affect    Data Reviewed: I have personally reviewed following labs and imaging studies  CBC: Recent Labs  Lab 03/09/21 1208 03/10/21 0505 03/10/21 1304 03/11/21 0445 03/12/21 0543  WBC 5.4 7.5 10.8* 10.3 10.4  NEUTROABS 3.5  --   --   --   --   HGB 13.6 13.5 13.1 11.8* 11.2*  HCT 38.5* 37.8* 37.6* 34.9* 32.3*  MCV 92.3 92.9 94.2 93.3 90.5  PLT 167 159 167 147* [id number]*   Basic Metabolic Panel: Recent Labs  Lab 03/09/21 1208  03/10/21 1304 03/11/21 0445 03/12/21 0543  NA 139  --  129* 130*  K 3.7  --  3.6 4.2  CL 102  --  95* 95*  CO2 28  --  26 28  GLUCOSE 108*  --  128* 113*  BUN 19  --  11 10  CREATININE 1.03 0.87 0.75 0.82  CALCIUM 9.1  --  8.1* 8.3*   GFR: CrCl cannot be calculated (Unknown ideal weight.). Liver Function Tests: No results for input(s): AST, ALT, ALKPHOS, BILITOT, PROT, ALBUMIN in the last 168 hours. No results for input(s): LIPASE, AMYLASE in the last 168 hours. No results for input(s): AMMONIA in the last 168 hours. Coagulation Profile: Recent Labs  Lab 03/09/21 1208  INR 1.0   Cardiac Enzymes: No results for input(s): CKTOTAL, CKMB, CKMBINDEX, TROPONINI in the last 168 hours. BNP (last 3 results) No results for input(s): PROBNP in the last 8760 hours. HbA1C: No results for input(s): HGBA1C in the last 72 hours. CBG: No results for input(s): GLUCAP in the last 168 hours. Lipid Profile: No results for input(s): CHOL, HDL, LDLCALC, TRIG, CHOLHDL, LDLDIRECT in the last 72 hours. Thyroid Function Tests: No results for input(s): TSH, T4TOTAL, FREET4, T3FREE, THYROIDAB in the last 72 hours. Anemia Panel: No results for input(s): VITAMINB12, FOLATE, FERRITIN, TIBC, IRON, RETICCTPCT in the last 72 hours. Sepsis Labs: No results for input(s): PROCALCITON, LATICACIDVEN in the last 168 hours.  Recent Results (from the past 240 hour(s))  Resp Panel by RT-PCR (Flu A&B, Covid) Nasopharyngeal Swab     Status: None   Collection Time: 03/09/21 12:15 PM   Specimen: Nasopharyngeal Swab; Nasopharyngeal(NP) swabs in vial transport medium  Result Value Ref Range Status   SARS Coronavirus 2 by RT PCR NEGATIVE NEGATIVE Final    Comment: (NOTE) SARS-CoV-2 target nucleic acids are NOT DETECTED.  The SARS-CoV-2 RNA is generally detectable in upper respiratory specimens during the acute phase of infection. The lowest concentration of SARS-CoV-2 viral copies this assay can detect is 138  copies/mL. A negative result does not preclude SARS-Cov-2 infection and should not be used as the sole basis for treatment or other patient management decisions. A negative result may occur with  improper specimen collection/handling, submission of specimen other than nasopharyngeal swab, presence of viral mutation(s) within the areas targeted by this assay, and inadequate number of viral copies(<138 copies/mL). A negative result must be combined with clinical observations, patient history, and epidemiological information. The expected result is Negative.  Fact Sheet for Patients:  EntrepreneurPulse.com.au  Fact Sheet for Healthcare Providers:  IncredibleEmployment.be  This test is no t yet approved or cleared by the [place] FDA and  has been authorized for detection and/or diagnosis of SARS-CoV-2  by FDA under an Emergency Use Authorization (EUA). This EUA will remain  in effect (meaning this test can be used) for the duration of the COVID-19 declaration under Section 564(b)(1) of the Act, 21 U.S.C.section 360bbb-3(b)(1), unless the authorization is terminated  or revoked sooner.       Influenza A by PCR NEGATIVE NEGATIVE Final   Influenza B by PCR NEGATIVE NEGATIVE Final    Comment: (NOTE) The Xpert Xpress SARS-CoV-2/FLU/RSV plus assay is intended as an aid in the diagnosis of influenza from Nasopharyngeal swab specimens and should not be used as a sole basis for treatment. Nasal washings and aspirates are unacceptable for Xpert Xpress SARS-CoV-2/FLU/RSV testing.  Fact Sheet for Patients: EntrepreneurPulse.com.au  Fact Sheet for Healthcare Providers: IncredibleEmployment.be  This test is not yet approved or cleared by the [place] FDA and has been authorized for detection and/or diagnosis of SARS-CoV-2 by FDA under an Emergency Use Authorization (EUA). This EUA will remain in effect (meaning  this test can be used) for the duration of the COVID-19 declaration under Section 564(b)(1) of the Act, 21 U.S.C. section 360bbb-3(b)(1), unless the authorization is terminated or revoked.  Performed at [facility], [address], [place], [place] [address]   Surgical pcr screen     Status: None   Collection Time: 03/10/21  1:45 AM   Specimen: Nasal Mucosa; Nasal Swab  Result Value Ref Range Status   MRSA, PCR NEGATIVE NEGATIVE Final   Staphylococcus aureus NEGATIVE NEGATIVE Final    Comment: (NOTE) The Xpert SA Assay (FDA approved for NASAL specimens in patients 27 years of age and older), is one component of a comprehensive surveillance program. It is not intended to diagnose infection nor to guide or monitor treatment. Performed at [facility], [address] [place], [place] [address]          Radiology Studies: DG Hip Port Unilat With Pelvis 1V Right  Result Date: 03/10/2021 CLINICAL DATA:  Postop EXAM: DG HIP (WITH OR WITHOUT PELVIS) 1V PORT RIGHT COMPARISON:  March 09, 2021 FINDINGS: Status post RIGHT hip arthroplasty. Orthopedic hardware is intact and without periprosthetic fracture or lucency. Osteopenia. Surgical staples and subcutaneous air. IMPRESSION: Expected postsurgical appearance status post RIGHT hip arthroplasty. Electronically Signed   By: Valentino Saxon M.D.   On: 03/10/2021 11:52        Scheduled Meds:  aspirin  81 mg Oral Daily   chlorhexidine  1 application Topical Once   docusate sodium  100 mg Oral BID   enoxaparin (LOVENOX) injection  30 mg Subcutaneous Q24H   feeding supplement  237 mL Oral BID BM   ferrous sulfate  325 mg Oral Q breakfast   folic acid  1 mg Oral Daily   gabapentin  300 mg Oral QHS   multivitamin with minerals  1 tablet Oral Daily   thiamine  100 mg Oral Daily   Or   thiamine  100 mg Intravenous Daily   Continuous Infusions:  sodium chloride 75 mL/hr at 03/11/21 0603   methocarbamol  (ROBAXIN) IV       LOS: 3 days    Time spent: 30 mins     Wyvonnia Dusky, MD [facility] Pager [phone]  If 7PM-7AM, please contact night-coverage 03/12/2021, 8:07 AM

## 2021-03-12 NOTE — TOC Progression Note (Addendum)
Transition of Care [facility]) - Progression Note    Patient Details  Name: Alan Brock MRN: [id number] Date of Birth: 20-Jan-1946  Transition of Care [facility]) CM/SW [place], RN Phone Number: 03/12/2021, 1:32 PM  Clinical Narrative:     The patient and his family has [facility] and family and patient accepted I called [facility] and spoke with Tammy and started the insurance auth request, , Awaiting approval      Expected Discharge Plan and Services                                                 Social Determinants of Health (SDOH) Interventions    Readmission Risk Interventions No flowsheet data found.

## 2021-03-13 DIAGNOSIS — E569 Vitamin deficiency, unspecified: Secondary | ICD-10-CM | POA: Diagnosis not present

## 2021-03-13 DIAGNOSIS — S72001A Fracture of unspecified part of neck of right femur, initial encounter for closed fracture: Secondary | ICD-10-CM | POA: Diagnosis not present

## 2021-03-13 DIAGNOSIS — S72001D Fracture of unspecified part of neck of right femur, subsequent encounter for closed fracture with routine healing: Secondary | ICD-10-CM | POA: Diagnosis not present

## 2021-03-13 DIAGNOSIS — F1010 Alcohol abuse, uncomplicated: Secondary | ICD-10-CM | POA: Diagnosis not present

## 2021-03-13 DIAGNOSIS — G4700 Insomnia, unspecified: Secondary | ICD-10-CM | POA: Diagnosis not present

## 2021-03-13 DIAGNOSIS — E440 Moderate protein-calorie malnutrition: Secondary | ICD-10-CM | POA: Diagnosis not present

## 2021-03-13 DIAGNOSIS — R531 Weakness: Secondary | ICD-10-CM | POA: Diagnosis not present

## 2021-03-13 DIAGNOSIS — Z736 Limitation of activities due to disability: Secondary | ICD-10-CM | POA: Diagnosis not present

## 2021-03-13 DIAGNOSIS — I5020 Unspecified systolic (congestive) heart failure: Secondary | ICD-10-CM | POA: Diagnosis not present

## 2021-03-13 DIAGNOSIS — M6281 Muscle weakness (generalized): Secondary | ICD-10-CM | POA: Diagnosis not present

## 2021-03-13 DIAGNOSIS — W19XXXA Unspecified fall, initial encounter: Secondary | ICD-10-CM | POA: Diagnosis not present

## 2021-03-13 DIAGNOSIS — I5032 Chronic diastolic (congestive) heart failure: Secondary | ICD-10-CM | POA: Diagnosis not present

## 2021-03-13 DIAGNOSIS — I10 Essential (primary) hypertension: Secondary | ICD-10-CM | POA: Diagnosis not present

## 2021-03-13 DIAGNOSIS — M84459D Pathological fracture, hip, unspecified, subsequent encounter for fracture with routine healing: Secondary | ICD-10-CM | POA: Diagnosis not present

## 2021-03-13 DIAGNOSIS — R5381 Other malaise: Secondary | ICD-10-CM | POA: Diagnosis not present

## 2021-03-13 DIAGNOSIS — M84359D Stress fracture, hip, unspecified, subsequent encounter for fracture with routine healing: Secondary | ICD-10-CM | POA: Diagnosis not present

## 2021-03-13 DIAGNOSIS — Z7401 Bed confinement status: Secondary | ICD-10-CM | POA: Diagnosis not present

## 2021-03-13 DIAGNOSIS — M62838 Other muscle spasm: Secondary | ICD-10-CM | POA: Diagnosis not present

## 2021-03-13 DIAGNOSIS — R52 Pain, unspecified: Secondary | ICD-10-CM | POA: Diagnosis not present

## 2021-03-13 LAB — CBC
HCT: 31.5 % — ABNORMAL LOW (ref 39.0–52.0)
Hemoglobin: 10.9 g/dL — ABNORMAL LOW (ref 13.0–17.0)
MCH: 31.7 pg (ref 26.0–34.0)
MCHC: 34.6 g/dL (ref 30.0–36.0)
MCV: 91.6 fL (ref 80.0–100.0)
Platelets: 178 10*3/uL (ref 150–400)
RBC: 3.44 MIL/uL — ABNORMAL LOW (ref 4.22–5.81)
RDW: 12.2 % (ref 11.5–15.5)
WBC: 9.7 10*3/uL (ref 4.0–10.5)
nRBC: 0 % (ref 0.0–0.2)

## 2021-03-13 LAB — BASIC METABOLIC PANEL
Anion gap: 5 (ref 5–15)
BUN: 20 mg/dL (ref 8–23)
CO2: 30 mmol/L (ref 22–32)
Calcium: 8.6 mg/dL — ABNORMAL LOW (ref 8.9–10.3)
Chloride: 97 mmol/L — ABNORMAL LOW (ref 98–111)
Creatinine, Ser: 0.86 mg/dL (ref 0.61–1.24)
GFR, Estimated: >60 mL/min (ref >60)
Glucose, Bld: 132 mg/dL — ABNORMAL HIGH (ref 70–99)
Potassium: 4.1 mmol/L (ref 3.5–5.1)
Sodium: 132 mmol/L — ABNORMAL LOW (ref 135–145)

## 2021-03-13 LAB — RESP PANEL BY RT-PCR (FLU A&B, COVID) ARPGX2
Influenza A by PCR: NEGATIVE
Influenza B by PCR: NEGATIVE
SARS Coronavirus 2 by RT PCR: NEGATIVE

## 2021-03-13 MED ORDER — HYDROCODONE-ACETAMINOPHEN 7.5-325 MG PO TABS: 1.0000 | ORAL_TABLET | Freq: Four times a day (QID) | ORAL | 0 refills | Status: AC | PRN

## 2021-03-13 MED ORDER — ADULT MULTIVITAMIN W/MINERALS CH: 1.0000 | ORAL_TABLET | Freq: Every day | ORAL | 0 refills | Status: DC

## 2021-03-13 MED ORDER — FOLIC ACID 1 MG PO TABS
1.0000 mg | ORAL_TABLET | Freq: Every day | ORAL | 0 refills | Status: DC
Start: 2021-03-13 — End: 2022-11-13

## 2021-03-13 MED ORDER — ENOXAPARIN SODIUM 30 MG/0.3ML IJ SOSY: 30.0000 mg | PREFILLED_SYRINGE | INTRAMUSCULAR | 0 refills | Status: DC

## 2021-03-13 MED ORDER — ENSURE ENLIVE PO LIQD: 237.0000 mL | Freq: Two times a day (BID) | ORAL | 0 refills | Status: DC

## 2021-03-13 MED ORDER — POLYETHYLENE GLYCOL 3350 17 G PO PACK
17.0000 g | PACK | Freq: Every day | ORAL | Status: DC
  Administered 2021-03-13: 17 g via ORAL
  Filled 2021-03-13: qty 1

## 2021-03-13 MED ORDER — POLYETHYLENE GLYCOL 3350 17 G PO PACK: 17.0000 g | PACK | Freq: Every day | ORAL | 0 refills | Status: DC | PRN

## 2021-03-13 MED ORDER — THIAMINE HCL 100 MG PO TABS: 100.0000 mg | ORAL_TABLET | Freq: Every day | ORAL | 0 refills | Status: DC

## 2021-03-13 NOTE — Progress Notes (Signed)
Physical Therapy Treatment Patient Details Name: Alan Brock MRN: [id number] DOB: [birth year] Today's Date: 03/13/2021    History of Present Illness Pt admitted for R hip fx s/p falling from ladder. Pt is s/p R hip hemiarthroplasty with posterior hip precautions on 03/10/21. History includes HLD, HTN, fall, and alcohol abuse.    PT Comments    Pt making good progress towards goals. Pt able to take steps with RW and +2 assistance for safety to chair. Pt assisted to bed pan and able to have bowel movement with nurse notified at end of session. Awaiting insurance auth for discharge to SNF. Will see patient again today if not discharged   Follow Up Recommendations  SNF     Equipment Recommendations  Rolling walker with 5" wheels;3in1 (PT)    Recommendations for Other Services       Precautions / Restrictions Precautions Precautions: Posterior Hip Precaution Booklet Issued: Yes (comment) Restrictions Weight Bearing Restrictions: Yes RLE Weight Bearing: Partial weight bearing    Mobility  Bed Mobility Overal bed mobility: Needs Assistance Bed Mobility: Supine to Sit     Supine to sit: Max assist     General bed mobility comments: Assist with management of B LEs out of bed; Verbal cues for movement of trunk    Transfers Overall transfer level: Needs assistance Equipment used: Rolling walker (2 wheeled) Transfers: Sit to/from Stand Sit to Stand: Mod assist;+2 physical assistance;From elevated surface Stand pivot transfers: +2 physical assistance;Mod assist       General transfer comment: Pt requiring maximal verbal cueing for full hip and knee extension during transfer from raised surface and cueing for hand placement for ease of transfer  Ambulation/Gait Ambulation/Gait assistance: Mod assist;+2 physical assistance Gait Distance (Feet): 4 Feet Assistive device: Rolling walker (2 wheeled) Gait Pattern/deviations: Antalgic;Step-to pattern     General Gait  Details: Impaired foot clearance/step length 2/2 to pain and maximal verbal cues for UE usage to maintain PWB on R LE;   Stairs    Wheelchair Mobility    Modified Rankin (Stroke Patients Only)       Balance Overall balance assessment: Needs assistance Sitting-balance support: Bilateral upper extremity supported;Feet supported Sitting balance-Leahy Scale: Fair     Standing balance support: Bilateral upper extremity supported    Cognition Arousal/Alertness: Awake/alert Behavior During Therapy: WFL for tasks assessed/performed Overall Cognitive Status: Within Functional Limits for tasks assessed      General Comments: Improved alertness from yesterday; Continues to have difficulty remembering precautions      Exercises Other Exercises Other Exercises: Supine therex PROM/AAROM x 10 each: SAQ, hip abduction, ankle pumps, heel slides Other Exercises: Tolieting: presented in recliner with urgent need to use bedbad. Pt completed partial sit to stand with maxA x1 and assistance from another for placement of bedpan. Pt observed to be holding his breath and cued to not violate hip precautions. Pt required modA x 2 for sit to stand after pt had a BM. Pt required total assistance with personal hygiene. Pt required verbal cues for hand placement throughout transfers.    General Comments General comments (skin integrity, edema, etc.): Mild drainage noted on the aquacel dressing      Pertinent Vitals/Pain Pain Assessment: 0-10 Pain Score: 9  Pain Location: R LE Pain Descriptors / Indicators: Dull;Aching;Sharp Pain Intervention(s): Limited activity within patient's tolerance;Monitored during session;Premedicated before session;Repositioned;Utilized relaxation techniques    Home Living         Prior Function  PT Goals (current goals can now be found in the care plan section) Acute Rehab PT Goals Patient Stated Goal: to be as independent as possible PT Goal  Formulation: With patient Time For Goal Achievement: 03/25/21 Potential to Achieve Goals: Good Progress towards PT goals: Progressing toward goals    Frequency    BID      PT Plan Current plan remains appropriate    Co-evaluation              AM-PAC PT "6 Clicks" Mobility   Outcome Measure  Help needed turning from your back to your side while in a flat bed without using bedrails?: A Lot Help needed moving from lying on your back to sitting on the side of a flat bed without using bedrails?: A Lot Help needed moving to and from a bed to a chair (including a wheelchair)?: A Lot Help needed standing up from a chair using your arms (e.g., wheelchair or bedside chair)?: A Lot Help needed to walk in hospital room?: Total Help needed climbing 3-5 steps with a railing? : Total 6 Click Score: 10    End of Session Equipment Utilized During Treatment: Gait belt Activity Tolerance: Patient limited by pain;Patient tolerated treatment well Patient left: in chair;with call bell/phone within reach;with family/visitor present;with chair alarm set Nurse Communication: Mobility status;Other (comment) (BM) PT Visit Diagnosis: Muscle weakness (generalized) (M62.81);History of falling (Z91.81);Difficulty in walking, not elsewhere classified (R26.2);Pain Pain - Right/Left: Right Pain - part of body: Hip     Time: [id number] PT Time Calculation (min) (ACUTE ONLY): 41 min  Charges:  $Gait Training: 8-22 mins $Therapeutic Exercise: 8-22 mins $Therapeutic Activity: 8-22 mins                     Alan Brock, SPT  Alan Brock 03/13/2021, 1:11 PM

## 2021-03-13 NOTE — Progress Notes (Signed)
Pt discharged to [facility].  IV removed without complication.  AVS placed in packet for delivery.  All belongings at bedside taken with pt.  Pt transported to facility on stretcher by EMS.  Report called to Simonne Come, LPN

## 2021-03-13 NOTE — TOC Progression Note (Signed)
Transition of Care [facility]) - Progression Note    Patient Details  Name: ANAN HOARD MRN: [id number] Date of Birth: 1946-06-10  Transition of Care [facility]) CM/SW [place], RN Phone Number: 03/13/2021, 2:00 PM  Clinical Narrative:     The [facility] is being [facility] today transported by [facility] approved [id number], The patient is covid negative, his daughter is in the room they are aware, there are 2 patients ahead to transport, the bedside nurse called report       Expected Discharge Plan and Services           Expected Discharge Date: 03/13/21                                     Social Determinants of Health (SDOH) Interventions    Readmission Risk Interventions No flowsheet data found.

## 2021-03-13 NOTE — Progress Notes (Signed)
Subjective: 3 Days Post-Op Procedure(s) (LRB): ARTHROPLASTY BIPOLAR HIP (HEMIARTHROPLASTY) (Right)   Doing well and ready for skilled nursing transfer today.  Pain is better.  Has started PT.  They feel he is not safe for home discharge.  Patient reports pain as mild.  Objective:   VITALS:   Vitals:   03/13/21 0843 03/13/21 1204  BP: 135/77   Pulse: 99   Resp: 16   Temp: 97.6 F (36.4 C) 98.3 F (36.8 C)  SpO2: 97%     Neurologically intact Incision: no drainage  LABS Recent Labs    03/11/21 0445 03/12/21 0543 03/13/21 0423  HGB 11.8* 11.2* 10.9*  HCT 34.9* 32.3* 31.5*  WBC 10.3 10.4 9.7  PLT 147* 147* 178    Recent Labs    03/11/21 0445 03/12/21 0543 03/13/21 0423  NA 129* 130* 132*  K 3.6 4.2 4.1  BUN '11 10 20  '$ CREATININE 0.75 0.82 0.86  GLUCOSE 128* 113* 132*    No results for input(s): LABPT, INR in the last 72 hours.   Assessment/Plan: 3 Days Post-Op Procedure(s) (LRB): ARTHROPLASTY BIPOLAR HIP (HEMIARTHROPLASTY) (Right)   Discharge to SNF  Return to clinic 2 weeks  Partial weightbearing right leg with walker

## 2021-03-13 NOTE — Discharge Summary (Signed)
Alan Brock NAME: Alan Brock    MR#:  [id number]  DATE OF BIRTH:  [birth year]  DATE OF ADMISSION:  03/09/2021 ADMITTING PHYSICIAN: Ivor Costa, MD  DATE OF DISCHARGE: 03/13/2021  PRIMARY CARE PHYSICIAN: Idelle Crouch, MD    ADMISSION DIAGNOSIS:  Right hip pain [M25.551] Closed right hip fracture ([place]) [S72.001A] Fall, initial encounter [W19.XXXA] Closed right hip fracture, initial encounter ([place]) [S72.001A] Closed displaced fracture of right femoral neck ([place]) [S72.001A]  DISCHARGE DIAGNOSIS:  Principal Problem:   Closed displaced fracture of right femoral neck (HCC) Active Problems:   Hyperlipidemia   Hypertension   Chronic diastolic CHF (congestive heart failure) ([place])   Fall at home, initial encounter   Alcohol use   Malnutrition of moderate degree   SECONDARY DIAGNOSIS:   Past Medical History:  Diagnosis Date   Adenomatous polyps    BPH (benign prostatic hyperplasia)    Cancer (HCC)    SKIN   DDD (degenerative disc disease), cervical    Elevated PSA    Hyperlipidemia    Hypertension     HOSPITAL COURSE:   Closed displaced fracture of the right femoral neck secondary to fall at home.  Patient had right hip hemiarthroplasty on 8/28.  Physical therapy recommends rehab.  Will prescribe Lovenox DVT prophylaxis for 14 days.  Repeating COVID test prior to going out to rehab and trying to get a bowel movement before discharge. Alcohol abuse.  No signs of withdrawal.  Folic acid thiamine and multivitamin prescribed Chronic diastolic congestive heart failure.  No signs of heart failure on this hospital stay.  Not on any CHF medications.  Last EF greater than 55%. Thrombocytopenia.  Platelet count has improved to 178.  Follow-up as outpatient. Moderate malnutrition continue Ensure  DISCHARGE CONDITIONS:   Satisfactory  CONSULTS OBTAINED:  Treatment Team:  Earnestine Leys, MD  DRUG ALLERGIES:  No Known  Allergies  DISCHARGE MEDICATIONS:   Allergies as of 03/13/2021   No Known Allergies      Medication List     STOP taking these medications    tadalafil 20 MG tablet Commonly known as: CIALIS   VITAMIN B-12 IJ       TAKE these medications    acetaminophen 325 MG tablet Commonly known as: TYLENOL Take 650 mg by mouth every 6 (six) hours as needed.   aspirin 81 MG chewable tablet Chew 81 mg by mouth daily.   Biotin 10 MG Caps Take 10,000 mcg by mouth daily at 6 (six) AM.   enoxaparin 30 MG/0.3ML injection Commonly known as: LOVENOX Inject 0.3 mLs (30 mg total) into the skin daily for 14 days. Start taking on: March 14, 2021   feeding supplement Liqd Take 237 mLs by mouth 2 (two) times daily between meals.   folic acid 1 MG tablet Commonly known as: FOLVITE Take 1 tablet (1 mg total) by mouth daily.   gabapentin 300 MG capsule Commonly known as: NEURONTIN Take 300 mg by mouth at bedtime.   HYDROcodone-acetaminophen 7.5-325 MG tablet Commonly known as: NORCO Take 1 tablet by mouth every 6 (six) hours as needed for up to 4 days for severe pain or moderate pain (pain score 7-10).   multivitamin with minerals Tabs tablet Take 1 tablet by mouth daily.   polyethylene glycol 17 g packet Commonly known as: MIRALAX / GLYCOLAX Take 17 g by mouth daily as needed for severe constipation.   thiamine 100 MG tablet Take 1 tablet (  100 mg total) by mouth daily.         DISCHARGE INSTRUCTIONS:   Follow-up with team at rehab 1 day Follow-up orthopedic surgery  If you experience worsening of your admission symptoms, develop shortness of breath, life threatening emergency, suicidal or homicidal thoughts you must seek medical attention immediately by calling 911 or calling your MD immediately  if symptoms less severe.  You Must read complete instructions/literature along with all the possible adverse reactions/side effects for all the Medicines you take and that  have been prescribed to you. Take any new Medicines after you have completely understood and accept all the possible adverse reactions/side effects.   Please note  You were cared for by a hospitalist during your hospital stay. If you have any questions about your discharge medications or the care you received while you were in the hospital after you are discharged, you can call the unit and asked to speak with the hospitalist on call if the hospitalist that took care of you is not available. Once you are discharged, your primary care physician will handle any further medical issues. Please note that NO REFILLS for any discharge medications will be authorized once you are discharged, as it is imperative that you return to your primary care physician (or establish a relationship with a primary care physician if you do not have one) for your aftercare needs so that they can reassess your need for medications and monitor your lab values.    Today   CHIEF COMPLAINT:   Chief Complaint  Patient presents with   Hip Pain    HISTORY OF PRESENT ILLNESS:  Alan Brock  is a 75 y.o. male came in after a fall and found to have hip pain and hip fracture   VITAL SIGNS:  Blood pressure (!) 123/59, pulse (!) 110, temperature 99.5 F (37.5 C), resp. rate 16, SpO2 91 %.  I/O:  No intake or output data in the 24 hours ending 03/13/21 0826  PHYSICAL EXAMINATION:  GENERAL:  75 y.o.-year-old patient lying in the bed with no acute distress.  EYES: Pupils equal, round, reactive to light and accommodation. No scleral icterus.  HEENT: Head atraumatic, normocephalic. Oropharynx and nasopharynx clear.  LUNGS: Normal breath sounds bilaterally, no wheezing, rales,rhonchi or crepitation. No use of accessory muscles of respiration.  CARDIOVASCULAR: S1, S2 normal. No murmurs, rubs, or gallops.  ABDOMEN: Soft, non-tender, non-distended.  EXTREMITIES: No pedal edema.  NEUROLOGIC: Cranial nerves II through XII are  intact.   PSYCHIATRIC: The patient is alert and answers questions appropriately.  SKIN: No obvious rash, lesion, or ulcer.   DATA REVIEW:   CBC Recent Labs  Lab 03/13/21 0423  WBC 9.7  HGB 10.9*  HCT 31.5*  PLT 178    Chemistries  Recent Labs  Lab 03/13/21 0423  NA 132*  K 4.1  CL 97*  CO2 30  GLUCOSE 132*  BUN 20  CREATININE 0.86  CALCIUM 8.6*     Microbiology Results  Results for orders placed or performed during the hospital encounter of 03/09/21  Resp Panel by RT-PCR (Flu A&B, Covid) Nasopharyngeal Swab     Status: None   Collection Time: 03/09/21 12:15 PM   Specimen: Nasopharyngeal Swab; Nasopharyngeal(NP) swabs in vial transport medium  Result Value Ref Range Status   SARS Coronavirus 2 by RT PCR NEGATIVE NEGATIVE Final    Comment: (NOTE) SARS-CoV-2 target nucleic acids are NOT DETECTED.  The SARS-CoV-2 RNA is generally detectable in upper respiratory specimens during  the acute phase of infection. The lowest concentration of SARS-CoV-2 viral copies this assay can detect is 138 copies/mL. A negative result does not preclude SARS-Cov-2 infection and should not be used as the sole basis for treatment or other patient management decisions. A negative result may occur with  improper specimen collection/handling, submission of specimen other than nasopharyngeal swab, presence of viral mutation(s) within the areas targeted by this assay, and inadequate number of viral copies(<138 copies/mL). A negative result must be combined with clinical observations, patient history, and epidemiological information. The expected result is Negative.  Fact Sheet for Patients:  EntrepreneurPulse.com.au  Fact Sheet for Healthcare Providers:  IncredibleEmployment.be  This test is no t yet approved or cleared by the [place] FDA and  has been authorized for detection and/or diagnosis of SARS-CoV-2 by FDA under an Emergency Use  Authorization (EUA). This EUA will remain  in effect (meaning this test can be used) for the duration of the COVID-19 declaration under Section 564(b)(1) of the Act, 21 U.S.C.section 360bbb-3(b)(1), unless the authorization is terminated  or revoked sooner.       Influenza A by PCR NEGATIVE NEGATIVE Final   Influenza B by PCR NEGATIVE NEGATIVE Final    Comment: (NOTE) The Xpert Xpress SARS-CoV-2/FLU/RSV plus assay is intended as an aid in the diagnosis of influenza from Nasopharyngeal swab specimens and should not be used as a sole basis for treatment. Nasal washings and aspirates are unacceptable for Xpert Xpress SARS-CoV-2/FLU/RSV testing.  Fact Sheet for Patients: EntrepreneurPulse.com.au  Fact Sheet for Healthcare Providers: IncredibleEmployment.be  This test is not yet approved or cleared by the [place] FDA and has been authorized for detection and/or diagnosis of SARS-CoV-2 by FDA under an Emergency Use Authorization (EUA). This EUA will remain in effect (meaning this test can be used) for the duration of the COVID-19 declaration under Section 564(b)(1) of the Act, 21 U.S.C. section 360bbb-3(b)(1), unless the authorization is terminated or revoked.  Performed at [facility], [address], [place], [place] [address]   Surgical pcr screen     Status: None   Collection Time: 03/10/21  1:45 AM   Specimen: Nasal Mucosa; Nasal Swab  Result Value Ref Range Status   MRSA, PCR NEGATIVE NEGATIVE Final   Staphylococcus aureus NEGATIVE NEGATIVE Final    Comment: (NOTE) The Xpert SA Assay (FDA approved for NASAL specimens in patients 55 years of age and older), is one component of a comprehensive surveillance program. It is not intended to diagnose infection nor to guide or monitor treatment. Performed at [facility], [address], [place], [place] [address]        Management plans discussed with the  patient, family and they are in agreement.  CODE STATUS:     Code Status Orders  (From admission, onward)           Start     Ordered   03/09/21 2205  Full code  Continuous        03/09/21 2204           Code Status History     This patient has a current code status but no historical code status.       TOTAL TIME TAKING CARE OF THIS PATIENT: 31 minutes.    Loletha Grayer M.D on 03/13/2021 at 8:26 AM   [facility]  CC: Primary care physician; Idelle Crouch, MD

## 2021-03-13 NOTE — Care Management Important Message (Signed)
Important Message  Patient Details  Name: Alan Brock MRN: [name] Date of Birth: 1945/09/26   Medicare Important Message Given:  Yes     [name]  03/13/2021, 10:39 AM

## 2021-03-14 DIAGNOSIS — M84359D Stress fracture, hip, unspecified, subsequent encounter for fracture with routine healing: Secondary | ICD-10-CM | POA: Diagnosis not present

## 2021-03-14 DIAGNOSIS — M62838 Other muscle spasm: Secondary | ICD-10-CM | POA: Diagnosis not present

## 2021-03-15 DIAGNOSIS — M84459D Pathological fracture, hip, unspecified, subsequent encounter for fracture with routine healing: Secondary | ICD-10-CM | POA: Diagnosis not present

## 2021-03-15 DIAGNOSIS — M62838 Other muscle spasm: Secondary | ICD-10-CM | POA: Diagnosis not present

## 2021-03-15 DIAGNOSIS — G4700 Insomnia, unspecified: Secondary | ICD-10-CM | POA: Diagnosis not present

## 2021-03-19 DIAGNOSIS — M84459D Pathological fracture, hip, unspecified, subsequent encounter for fracture with routine healing: Secondary | ICD-10-CM | POA: Diagnosis not present

## 2021-03-19 DIAGNOSIS — G4700 Insomnia, unspecified: Secondary | ICD-10-CM | POA: Diagnosis not present

## 2021-03-20 DIAGNOSIS — I10 Essential (primary) hypertension: Secondary | ICD-10-CM | POA: Diagnosis not present

## 2021-03-20 DIAGNOSIS — I5020 Unspecified systolic (congestive) heart failure: Secondary | ICD-10-CM | POA: Diagnosis not present

## 2021-03-20 DIAGNOSIS — G4700 Insomnia, unspecified: Secondary | ICD-10-CM | POA: Diagnosis not present

## 2021-03-20 DIAGNOSIS — M84459D Pathological fracture, hip, unspecified, subsequent encounter for fracture with routine healing: Secondary | ICD-10-CM | POA: Diagnosis not present

## 2021-03-22 DIAGNOSIS — Z87891 Personal history of nicotine dependence: Secondary | ICD-10-CM | POA: Diagnosis not present

## 2021-03-22 DIAGNOSIS — F1020 Alcohol dependence, uncomplicated: Secondary | ICD-10-CM | POA: Diagnosis not present

## 2021-03-22 DIAGNOSIS — E785 Hyperlipidemia, unspecified: Secondary | ICD-10-CM | POA: Diagnosis not present

## 2021-03-22 DIAGNOSIS — E440 Moderate protein-calorie malnutrition: Secondary | ICD-10-CM | POA: Diagnosis not present

## 2021-03-22 DIAGNOSIS — Z86010 Personal history of colonic polyps: Secondary | ICD-10-CM | POA: Diagnosis not present

## 2021-03-22 DIAGNOSIS — M5030 Other cervical disc degeneration, unspecified cervical region: Secondary | ICD-10-CM | POA: Diagnosis not present

## 2021-03-22 DIAGNOSIS — Z9049 Acquired absence of other specified parts of digestive tract: Secondary | ICD-10-CM | POA: Diagnosis not present

## 2021-03-22 DIAGNOSIS — Z85828 Personal history of other malignant neoplasm of skin: Secondary | ICD-10-CM | POA: Diagnosis not present

## 2021-03-22 DIAGNOSIS — I110 Hypertensive heart disease with heart failure: Secondary | ICD-10-CM | POA: Diagnosis not present

## 2021-03-22 DIAGNOSIS — Z9181 History of falling: Secondary | ICD-10-CM | POA: Diagnosis not present

## 2021-03-22 DIAGNOSIS — Z7982 Long term (current) use of aspirin: Secondary | ICD-10-CM | POA: Diagnosis not present

## 2021-03-22 DIAGNOSIS — Z96642 Presence of left artificial hip joint: Secondary | ICD-10-CM | POA: Diagnosis not present

## 2021-03-22 DIAGNOSIS — G4700 Insomnia, unspecified: Secondary | ICD-10-CM | POA: Diagnosis not present

## 2021-03-22 DIAGNOSIS — I5042 Chronic combined systolic (congestive) and diastolic (congestive) heart failure: Secondary | ICD-10-CM | POA: Diagnosis not present

## 2021-03-22 DIAGNOSIS — Z961 Presence of intraocular lens: Secondary | ICD-10-CM | POA: Diagnosis not present

## 2021-03-22 DIAGNOSIS — M84451D Pathological fracture, right femur, subsequent encounter for fracture with routine healing: Secondary | ICD-10-CM | POA: Diagnosis not present

## 2021-03-22 DIAGNOSIS — Z9842 Cataract extraction status, left eye: Secondary | ICD-10-CM | POA: Diagnosis not present

## 2021-03-26 DIAGNOSIS — E440 Moderate protein-calorie malnutrition: Secondary | ICD-10-CM | POA: Diagnosis not present

## 2021-03-26 DIAGNOSIS — M5030 Other cervical disc degeneration, unspecified cervical region: Secondary | ICD-10-CM | POA: Diagnosis not present

## 2021-03-26 DIAGNOSIS — Z9049 Acquired absence of other specified parts of digestive tract: Secondary | ICD-10-CM | POA: Diagnosis not present

## 2021-03-26 DIAGNOSIS — Z87891 Personal history of nicotine dependence: Secondary | ICD-10-CM | POA: Diagnosis not present

## 2021-03-26 DIAGNOSIS — M84451D Pathological fracture, right femur, subsequent encounter for fracture with routine healing: Secondary | ICD-10-CM | POA: Diagnosis not present

## 2021-03-26 DIAGNOSIS — Z9842 Cataract extraction status, left eye: Secondary | ICD-10-CM | POA: Diagnosis not present

## 2021-03-26 DIAGNOSIS — Z86010 Personal history of colonic polyps: Secondary | ICD-10-CM | POA: Diagnosis not present

## 2021-03-26 DIAGNOSIS — Z7982 Long term (current) use of aspirin: Secondary | ICD-10-CM | POA: Diagnosis not present

## 2021-03-26 DIAGNOSIS — I5042 Chronic combined systolic (congestive) and diastolic (congestive) heart failure: Secondary | ICD-10-CM | POA: Diagnosis not present

## 2021-03-26 DIAGNOSIS — E785 Hyperlipidemia, unspecified: Secondary | ICD-10-CM | POA: Diagnosis not present

## 2021-03-26 DIAGNOSIS — I110 Hypertensive heart disease with heart failure: Secondary | ICD-10-CM | POA: Diagnosis not present

## 2021-03-26 DIAGNOSIS — Z9181 History of falling: Secondary | ICD-10-CM | POA: Diagnosis not present

## 2021-03-26 DIAGNOSIS — G4700 Insomnia, unspecified: Secondary | ICD-10-CM | POA: Diagnosis not present

## 2021-03-26 DIAGNOSIS — Z961 Presence of intraocular lens: Secondary | ICD-10-CM | POA: Diagnosis not present

## 2021-03-26 DIAGNOSIS — Z85828 Personal history of other malignant neoplasm of skin: Secondary | ICD-10-CM | POA: Diagnosis not present

## 2021-03-26 DIAGNOSIS — F1020 Alcohol dependence, uncomplicated: Secondary | ICD-10-CM | POA: Diagnosis not present

## 2021-03-26 DIAGNOSIS — Z96642 Presence of left artificial hip joint: Secondary | ICD-10-CM | POA: Diagnosis not present

## 2021-03-27 DIAGNOSIS — S72031A Displaced midcervical fracture of right femur, initial encounter for closed fracture: Secondary | ICD-10-CM | POA: Diagnosis not present

## 2021-03-29 DIAGNOSIS — I110 Hypertensive heart disease with heart failure: Secondary | ICD-10-CM | POA: Diagnosis not present

## 2021-03-29 DIAGNOSIS — I5042 Chronic combined systolic (congestive) and diastolic (congestive) heart failure: Secondary | ICD-10-CM | POA: Diagnosis not present

## 2021-03-29 DIAGNOSIS — M5030 Other cervical disc degeneration, unspecified cervical region: Secondary | ICD-10-CM | POA: Diagnosis not present

## 2021-03-29 DIAGNOSIS — M84451D Pathological fracture, right femur, subsequent encounter for fracture with routine healing: Secondary | ICD-10-CM | POA: Diagnosis not present

## 2021-03-29 DIAGNOSIS — F1020 Alcohol dependence, uncomplicated: Secondary | ICD-10-CM | POA: Diagnosis not present

## 2021-03-29 DIAGNOSIS — E440 Moderate protein-calorie malnutrition: Secondary | ICD-10-CM | POA: Diagnosis not present

## 2021-03-29 DIAGNOSIS — E785 Hyperlipidemia, unspecified: Secondary | ICD-10-CM | POA: Diagnosis not present

## 2021-04-02 DIAGNOSIS — Z79899 Other long term (current) drug therapy: Secondary | ICD-10-CM | POA: Diagnosis not present

## 2021-04-02 DIAGNOSIS — Z125 Encounter for screening for malignant neoplasm of prostate: Secondary | ICD-10-CM | POA: Diagnosis not present

## 2021-04-02 DIAGNOSIS — E538 Deficiency of other specified B group vitamins: Secondary | ICD-10-CM | POA: Diagnosis not present

## 2021-04-02 DIAGNOSIS — E7800 Pure hypercholesterolemia, unspecified: Secondary | ICD-10-CM | POA: Diagnosis not present

## 2021-04-02 DIAGNOSIS — I10 Essential (primary) hypertension: Secondary | ICD-10-CM | POA: Diagnosis not present

## 2021-04-15 DIAGNOSIS — Z961 Presence of intraocular lens: Secondary | ICD-10-CM | POA: Diagnosis not present

## 2021-04-15 DIAGNOSIS — F1020 Alcohol dependence, uncomplicated: Secondary | ICD-10-CM | POA: Diagnosis not present

## 2021-04-15 DIAGNOSIS — Z9049 Acquired absence of other specified parts of digestive tract: Secondary | ICD-10-CM | POA: Diagnosis not present

## 2021-04-15 DIAGNOSIS — Z96642 Presence of left artificial hip joint: Secondary | ICD-10-CM | POA: Diagnosis not present

## 2021-04-15 DIAGNOSIS — Z86010 Personal history of colonic polyps: Secondary | ICD-10-CM | POA: Diagnosis not present

## 2021-04-15 DIAGNOSIS — G4700 Insomnia, unspecified: Secondary | ICD-10-CM | POA: Diagnosis not present

## 2021-04-15 DIAGNOSIS — Z7982 Long term (current) use of aspirin: Secondary | ICD-10-CM | POA: Diagnosis not present

## 2021-04-15 DIAGNOSIS — M5030 Other cervical disc degeneration, unspecified cervical region: Secondary | ICD-10-CM | POA: Diagnosis not present

## 2021-04-15 DIAGNOSIS — Z85828 Personal history of other malignant neoplasm of skin: Secondary | ICD-10-CM | POA: Diagnosis not present

## 2021-04-15 DIAGNOSIS — I5042 Chronic combined systolic (congestive) and diastolic (congestive) heart failure: Secondary | ICD-10-CM | POA: Diagnosis not present

## 2021-04-15 DIAGNOSIS — E785 Hyperlipidemia, unspecified: Secondary | ICD-10-CM | POA: Diagnosis not present

## 2021-04-15 DIAGNOSIS — I110 Hypertensive heart disease with heart failure: Secondary | ICD-10-CM | POA: Diagnosis not present

## 2021-04-15 DIAGNOSIS — Z9181 History of falling: Secondary | ICD-10-CM | POA: Diagnosis not present

## 2021-04-15 DIAGNOSIS — Z87891 Personal history of nicotine dependence: Secondary | ICD-10-CM | POA: Diagnosis not present

## 2021-04-15 DIAGNOSIS — M84451D Pathological fracture, right femur, subsequent encounter for fracture with routine healing: Secondary | ICD-10-CM | POA: Diagnosis not present

## 2021-04-15 DIAGNOSIS — E440 Moderate protein-calorie malnutrition: Secondary | ICD-10-CM | POA: Diagnosis not present

## 2021-04-15 DIAGNOSIS — Z9842 Cataract extraction status, left eye: Secondary | ICD-10-CM | POA: Diagnosis not present

## 2021-04-16 DIAGNOSIS — Z79899 Other long term (current) drug therapy: Secondary | ICD-10-CM | POA: Diagnosis not present

## 2021-04-24 DIAGNOSIS — S72031D Displaced midcervical fracture of right femur, subsequent encounter for closed fracture with routine healing: Secondary | ICD-10-CM | POA: Diagnosis not present

## 2021-04-24 DIAGNOSIS — S72031A Displaced midcervical fracture of right femur, initial encounter for closed fracture: Secondary | ICD-10-CM | POA: Diagnosis not present

## 2021-05-01 DIAGNOSIS — S72031D Displaced midcervical fracture of right femur, subsequent encounter for closed fracture with routine healing: Secondary | ICD-10-CM | POA: Diagnosis not present

## 2021-05-02 DIAGNOSIS — E538 Deficiency of other specified B group vitamins: Secondary | ICD-10-CM | POA: Diagnosis not present

## 2021-05-03 DIAGNOSIS — S72031D Displaced midcervical fracture of right femur, subsequent encounter for closed fracture with routine healing: Secondary | ICD-10-CM | POA: Diagnosis not present

## 2021-05-07 DIAGNOSIS — S72031D Displaced midcervical fracture of right femur, subsequent encounter for closed fracture with routine healing: Secondary | ICD-10-CM | POA: Diagnosis not present

## 2021-05-09 DIAGNOSIS — S72031D Displaced midcervical fracture of right femur, subsequent encounter for closed fracture with routine healing: Secondary | ICD-10-CM | POA: Diagnosis not present

## 2021-05-20 DIAGNOSIS — S72031D Displaced midcervical fracture of right femur, subsequent encounter for closed fracture with routine healing: Secondary | ICD-10-CM | POA: Diagnosis not present

## 2021-05-22 DIAGNOSIS — S72031D Displaced midcervical fracture of right femur, subsequent encounter for closed fracture with routine healing: Secondary | ICD-10-CM | POA: Diagnosis not present

## 2021-07-23 DIAGNOSIS — E7800 Pure hypercholesterolemia, unspecified: Secondary | ICD-10-CM | POA: Diagnosis not present

## 2021-07-23 DIAGNOSIS — E538 Deficiency of other specified B group vitamins: Secondary | ICD-10-CM | POA: Diagnosis not present

## 2021-07-23 DIAGNOSIS — I10 Essential (primary) hypertension: Secondary | ICD-10-CM | POA: Diagnosis not present

## 2021-07-23 DIAGNOSIS — Z79899 Other long term (current) drug therapy: Secondary | ICD-10-CM | POA: Diagnosis not present

## 2021-07-23 DIAGNOSIS — D126 Benign neoplasm of colon, unspecified: Secondary | ICD-10-CM | POA: Diagnosis not present

## 2021-07-30 ENCOUNTER — Telehealth: Payer: Self-pay

## 2021-07-30 NOTE — Telephone Encounter (Signed)
Patient not ready to schedule he will call us when he is ready

## 2021-08-06 ENCOUNTER — Other Ambulatory Visit: Payer: Self-pay

## 2021-08-06 DIAGNOSIS — Z86010 Personal history of colonic polyps: Secondary | ICD-10-CM

## 2021-08-06 MED ORDER — NA SULFATE-K SULFATE-MG SULF 17.5-3.13-1.6 GM/177ML PO SOLN: 1.0000 | Freq: Once | ORAL | 0 refills | Status: AC

## 2021-08-06 NOTE — Progress Notes (Signed)
Gastroenterology Pre-Procedure Review  Request Date: 12/17/2021 Requesting Physician: Dr. Allen Norris  PATIENT REVIEW QUESTIONS: The patient responded to the following health history questions as indicated:    1. Are you having any GI issues? no 2. Do you have a personal history of Polyps? yes (LAST COLONOSCOPY HAD NONE BUT HAD PREVIOUS ) 3. Do you have a family history of Colon Cancer or Polyps? no 4. Diabetes Mellitus? no 5. Joint replacements in the past 12 months?yes ([place] 2018) 6. Major health problems in the past 3 months?no 7. Any artificial heart valves, MVP, or defibrillator?no    MEDICATIONS & ALLERGIES:    Patient reports the following regarding taking any anticoagulation/antiplatelet therapy:   Plavix, Coumadin, Eliquis, Xarelto, Lovenox, Pradaxa, Brilinta, or Effient? no Aspirin? yes (81MG )  Patient confirms/reports the following medications:  Current Outpatient Medications  Medication Sig Dispense Refill   aspirin 81 MG chewable tablet Chew 81 mg by mouth daily.     Biotin 10 MG CAPS Take 10,000 mcg by mouth daily at 6 (six) AM.     enoxaparin (LOVENOX) 30 MG/0.3ML injection Inject 0.3 mLs (30 mg total) into the skin daily for 14 days. 4.2 mL 0   feeding supplement (ENSURE ENLIVE / ENSURE PLUS) LIQD Take 237 mLs by mouth 2 (two) times daily between meals. 76195 mL 0   folic acid (FOLVITE) 1 MG tablet Take 1 tablet (1 mg total) by mouth daily. 30 tablet 0   gabapentin (NEURONTIN) 300 MG capsule Take 300 mg by mouth at bedtime.     Multiple Vitamin (MULTIVITAMIN WITH MINERALS) TABS tablet Take 1 tablet by mouth daily. 30 tablet 0   polyethylene glycol (MIRALAX / GLYCOLAX) 17 g packet Take 17 g by mouth daily as needed for severe constipation. 14 each 0   thiamine 100 MG tablet Take 1 tablet (100 mg total) by mouth daily. 30 tablet 0   No current facility-administered medications for this visit.    Patient confirms/reports the following allergies:  No  Known Allergies  No orders of the defined types were placed in this encounter.   AUTHORIZATION INFORMATION Primary Insurance: 1D#: Group #:  Secondary Insurance: 1D#: Group #:  SCHEDULE INFORMATION: Date: 12/17/2021 Time: Location:[facility]

## 2021-08-26 DIAGNOSIS — E538 Deficiency of other specified B group vitamins: Secondary | ICD-10-CM | POA: Diagnosis not present

## 2021-09-26 DIAGNOSIS — E538 Deficiency of other specified B group vitamins: Secondary | ICD-10-CM | POA: Diagnosis not present

## 2021-10-30 DIAGNOSIS — E7800 Pure hypercholesterolemia, unspecified: Secondary | ICD-10-CM | POA: Diagnosis not present

## 2021-10-30 DIAGNOSIS — Z79899 Other long term (current) drug therapy: Secondary | ICD-10-CM | POA: Diagnosis not present

## 2021-10-30 DIAGNOSIS — I10 Essential (primary) hypertension: Secondary | ICD-10-CM | POA: Diagnosis not present

## 2021-10-30 DIAGNOSIS — E538 Deficiency of other specified B group vitamins: Secondary | ICD-10-CM | POA: Diagnosis not present

## 2021-11-06 DIAGNOSIS — M5431 Sciatica, right side: Secondary | ICD-10-CM | POA: Diagnosis not present

## 2021-11-06 DIAGNOSIS — E538 Deficiency of other specified B group vitamins: Secondary | ICD-10-CM | POA: Diagnosis not present

## 2021-11-06 DIAGNOSIS — R972 Elevated prostate specific antigen [PSA]: Secondary | ICD-10-CM | POA: Diagnosis not present

## 2021-11-06 DIAGNOSIS — Z0000 Encounter for general adult medical examination without abnormal findings: Secondary | ICD-10-CM | POA: Diagnosis not present

## 2021-11-06 DIAGNOSIS — I10 Essential (primary) hypertension: Secondary | ICD-10-CM | POA: Diagnosis not present

## 2021-11-06 DIAGNOSIS — M5441 Lumbago with sciatica, right side: Secondary | ICD-10-CM | POA: Diagnosis not present

## 2021-11-06 DIAGNOSIS — M5440 Lumbago with sciatica, unspecified side: Secondary | ICD-10-CM | POA: Diagnosis not present

## 2021-11-06 DIAGNOSIS — E7800 Pure hypercholesterolemia, unspecified: Secondary | ICD-10-CM | POA: Diagnosis not present

## 2021-12-17 ENCOUNTER — Encounter: Payer: Self-pay | Admitting: Gastroenterology

## 2021-12-17 ENCOUNTER — Encounter
Admission: RE | Disposition: A | Discharge status: Home / Self Care | Payer: Self-pay | Source: Home / Self Care | Attending: Gastroenterology

## 2021-12-17 ENCOUNTER — Ambulatory Visit: Payer: PPO | Admitting: Registered Nurse

## 2021-12-17 ENCOUNTER — Ambulatory Visit
Admission: RE | Admit: 2021-12-17 | Discharge: 2021-12-17 | Disposition: A | Discharge status: Home / Self Care | Payer: PPO | Attending: Gastroenterology | Admitting: Gastroenterology

## 2021-12-17 DIAGNOSIS — Z85828 Personal history of other malignant neoplasm of skin: Secondary | ICD-10-CM | POA: Insufficient documentation

## 2021-12-17 DIAGNOSIS — Z1211 Encounter for screening for malignant neoplasm of colon: Secondary | ICD-10-CM | POA: Insufficient documentation

## 2021-12-17 DIAGNOSIS — Z860100 Personal history of colon polyps, unspecified: Secondary | ICD-10-CM

## 2021-12-17 DIAGNOSIS — E785 Hyperlipidemia, unspecified: Secondary | ICD-10-CM | POA: Insufficient documentation

## 2021-12-17 DIAGNOSIS — Z87891 Personal history of nicotine dependence: Secondary | ICD-10-CM | POA: Diagnosis not present

## 2021-12-17 DIAGNOSIS — K31811 Angiodysplasia of stomach and duodenum with bleeding: Secondary | ICD-10-CM | POA: Diagnosis not present

## 2021-12-17 DIAGNOSIS — K921 Melena: Secondary | ICD-10-CM

## 2021-12-17 DIAGNOSIS — K449 Diaphragmatic hernia without obstruction or gangrene: Secondary | ICD-10-CM | POA: Diagnosis not present

## 2021-12-17 DIAGNOSIS — I10 Essential (primary) hypertension: Secondary | ICD-10-CM | POA: Insufficient documentation

## 2021-12-17 DIAGNOSIS — Q2733 Arteriovenous malformation of digestive system vessel: Secondary | ICD-10-CM | POA: Diagnosis not present

## 2021-12-17 DIAGNOSIS — K648 Other hemorrhoids: Secondary | ICD-10-CM | POA: Diagnosis not present

## 2021-12-17 DIAGNOSIS — N400 Enlarged prostate without lower urinary tract symptoms: Secondary | ICD-10-CM | POA: Diagnosis not present

## 2021-12-17 HISTORY — PX: ESOPHAGOGASTRODUODENOSCOPY (EGD) WITH PROPOFOL: SHX5813

## 2021-12-17 HISTORY — PX: COLONOSCOPY WITH PROPOFOL: SHX5780

## 2021-12-17 SURGERY — COLONOSCOPY WITH PROPOFOL
Anesthesia: General

## 2021-12-17 MED ORDER — SODIUM CHLORIDE 0.9 % IV SOLN: INTRAVENOUS | Status: DC

## 2021-12-17 MED ORDER — PHENYLEPHRINE HCL (PRESSORS) 10 MG/ML IV SOLN
INTRAVENOUS | Status: DC | PRN
  Administered 2021-12-17: 80 ug via INTRAVENOUS
  Administered 2021-12-17: 160 ug via INTRAVENOUS

## 2021-12-17 MED ORDER — PROPOFOL 1000 MG/100ML IV EMUL
INTRAVENOUS | Status: AC
  Filled 2021-12-17: qty 300

## 2021-12-17 MED ORDER — LIDOCAINE HCL (CARDIAC) PF 100 MG/5ML IV SOSY
PREFILLED_SYRINGE | INTRAVENOUS | Status: DC | PRN
  Administered 2021-12-17: 60 mg via INTRAVENOUS

## 2021-12-17 MED ORDER — LIDOCAINE HCL (PF) 2 % IJ SOLN
INTRAMUSCULAR | Status: AC
  Filled 2021-12-17: qty 10

## 2021-12-17 MED ORDER — PROPOFOL 500 MG/50ML IV EMUL
INTRAVENOUS | Status: DC | PRN
  Administered 2021-12-17: 140 ug/kg/min via INTRAVENOUS

## 2021-12-17 MED ORDER — PROPOFOL 10 MG/ML IV BOLUS
INTRAVENOUS | Status: DC | PRN
  Administered 2021-12-17: 70 mg via INTRAVENOUS

## 2021-12-17 MED ORDER — DEXMEDETOMIDINE (PRECEDEX) IN NS 20 MCG/5ML (4 MCG/ML) IV SYRINGE
PREFILLED_SYRINGE | INTRAVENOUS | Status: DC | PRN
  Administered 2021-12-17: 8 ug via INTRAVENOUS

## 2021-12-17 NOTE — Transfer of Care (Signed)
Immediate Anesthesia Transfer of Care Note  Patient: Alan Brock  Procedure(s) Performed: COLONOSCOPY WITH PROPOFOL ESOPHAGOGASTRODUODENOSCOPY (EGD) WITH PROPOFOL  Patient Location: PACU  Anesthesia Type:General  Level of Consciousness: awake, alert  and oriented  Airway & Oxygen Therapy: Patient Spontanous Breathing  Post-op Assessment: Report given to RN and Post -op Vital signs reviewed and stable  Post vital signs: Reviewed and stable  Last Vitals:  Vitals Value Taken Time  BP 91/45 12/17/21 0812  Temp 36.3 C 12/17/21 0812  Pulse 70 12/17/21 0813  Resp 14 12/17/21 0813  SpO2 99 % 12/17/21 0813  Vitals shown include unvalidated device data.  Last Pain:  Vitals:   12/17/21 0812  TempSrc: Temporal  PainSc: Asleep         Complications: No notable events documented.

## 2021-12-17 NOTE — Op Note (Signed)
[facility] Patient Name: Alan Brock Procedure Date: 12/17/2021 7:29 AM MRN: [id number] Account #: [id number] Date of Birth: 11-06-1945 Admit Type: Outpatient Age: 76 Room: [facility] ENDO ROOM 4 Gender: Male Note Status: Finalized Instrument Name: Jasper Riling [id number] Procedure:             Colonoscopy Indications:           High risk colon cancer surveillance: Personal history                         of colonic polyps Providers:             Lucilla Lame MD, MD Referring MD:          Leonie Douglas. Doy Hutching, MD (Referring MD) Medicines:             Propofol per Anesthesia Complications:         No immediate complications. Procedure:             Pre-Anesthesia Assessment:                        - Prior to the procedure, a History and Physical was                         performed, and patient medications and allergies were                         reviewed. The patient's tolerance of previous                         anesthesia was also reviewed. The risks and benefits                         of the procedure and the sedation options and risks                         were discussed with the patient. All questions were                         answered, and informed consent was obtained. Prior                         Anticoagulants: The patient has taken no previous                         anticoagulant or antiplatelet agents. ASA Grade                         Assessment: II - A patient with mild systemic disease.                         After reviewing the risks and benefits, the patient                         was deemed in satisfactory condition to undergo the                         procedure.  After obtaining informed consent, the colonoscope was                         passed under direct vision. Throughout the procedure,                         the patient's blood pressure, pulse, and oxygen                         saturations were  monitored continuously. The                         Colonoscope was introduced through the anus and                         advanced to the the cecum, identified by appendiceal                         orifice and ileocecal valve. The colonoscopy was                         performed without difficulty. The patient tolerated                         the procedure well. The quality of the bowel                         preparation was excellent. Findings:      The perianal and digital rectal examinations were normal.      Non-bleeding internal hemorrhoids were found during retroflexion. The       hemorrhoids were Grade I (internal hemorrhoids that do not prolapse). Impression:            - Non-bleeding internal hemorrhoids.                        - No specimens collected. Recommendation:        - Discharge patient to home.                        - Resume previous diet.                        - Continue present medications.                        - Repeat colonoscopy is not recommended for                         surveillance. Procedure Code(s):     --- Professional ---                        [phone], Colonoscopy, flexible; diagnostic, including                         collection of specimen(s) by brushing or washing, when                         performed (separate procedure) Diagnosis Code(s):     --- Professional ---  Z86.010, Personal history of colonic polyps CPT copyright 2019 American Medical Association. All rights reserved. The codes documented in this report are preliminary and upon coder review may  be revised to meet current compliance requirements. Lucilla Lame MD, MD 12/17/2021 8:10:29 AM This report has been signed electronically. Number of Addenda: 0 Note Initiated On: 12/17/2021 7:29 AM Scope Withdrawal Time: 0 hours 7 minutes 24 seconds  Total Procedure Duration: 0 hours 12 minutes 43 seconds  Estimated Blood Loss:  Estimated blood loss: none.       [facility]

## 2021-12-17 NOTE — H&P (Signed)
Alan Lame, MD [facility] [address], [place], [place] [address] Phone:[phone] Fax : [phone]  Primary Care Physician:  Idelle Crouch, MD Primary Gastroenterologist:  Dr. Allen Norris  Pre-Procedure History & Physical: HPI:  Alan Brock is a 76 y.o. male is here for an endoscopy and colonoscopy.   Past Medical History:  Diagnosis Date   Adenomatous polyps    BPH (benign prostatic hyperplasia)    Cancer (HCC)    SKIN   DDD (degenerative disc disease), cervical    Elevated PSA    Hyperlipidemia    Hypertension     Past Surgical History:  Procedure Laterality Date   ADENOIDECTOMY     APPENDECTOMY     CATARACT EXTRACTION W/PHACO Left 02/02/2018   Procedure: CATARACT EXTRACTION PHACO AND INTRAOCULAR LENS PLACEMENT ([place]);  Surgeon: Birder Robson, MD;  Location: [facility] ORS;  Service: Ophthalmology;  Laterality: Left;  [place] 00:35.6 AP% 14.4 CDE 5.12 Fluid pack lot # [id number]   COLONOSCOPY     COLONOSCOPY WITH PROPOFOL N/A 12/03/2016   Procedure: COLONOSCOPY WITH PROPOFOL;  Surgeon: Manya Silvas, MD;  Location: [facility] ENDOSCOPY;  Service: Endoscopy;  Laterality: N/A;   ESOPHAGOGASTRODUODENOSCOPY     FLEXIBLE SIGMOIDOSCOPY     HIP ARTHROPLASTY Right 03/10/2021   Procedure: ARTHROPLASTY BIPOLAR HIP (HEMIARTHROPLASTY);  Surgeon: Earnestine Leys, MD;  Location: [facility] ORS;  Service: Orthopedics;  Laterality: Right;    Prior to Admission medications   Medication Sig Start Date End Date Taking? Authorizing Provider  aspirin 81 MG chewable tablet Chew 81 mg by mouth daily.    [provider]  Biotin 10 MG CAPS Take 10,000 mcg by mouth daily at 6 (six) AM.    [provider]  enoxaparin (LOVENOX) 30 MG/0.3ML injection Inject 0.3 mLs (30 mg total) into the skin daily for 14 days. 03/14/21 08/06/21  Loletha Grayer, MD  feeding supplement (ENSURE ENLIVE / ENSURE PLUS) LIQD Take 237 mLs by mouth 2 (two) times daily between meals. 03/13/21   Loletha Grayer,  MD  folic acid (FOLVITE) 1 MG tablet Take 1 tablet (1 mg total) by mouth daily. 03/13/21   Loletha Grayer, MD  gabapentin (NEURONTIN) 300 MG capsule Take 300 mg by mouth at bedtime. 01/10/21   [provider]  Multiple Vitamin (MULTIVITAMIN WITH MINERALS) TABS tablet Take 1 tablet by mouth daily. 03/13/21   Loletha Grayer, MD  polyethylene glycol (MIRALAX / GLYCOLAX) 17 g packet Take 17 g by mouth daily as needed for severe constipation. 03/13/21   Loletha Grayer, MD  thiamine 100 MG tablet Take 1 tablet (100 mg total) by mouth daily. 03/13/21   Loletha Grayer, MD    Allergies as of 08/06/2021   (No Known Allergies)    Family History  Problem Relation Age of Onset   Heart attack Mother    Heart attack Father     Social History   Socioeconomic History   Marital status: Married    Spouse name: Not on file   Number of children: Not on file   Years of education: Not on file   Highest education level: Not on file  Occupational History   Not on file  Tobacco Use   Smoking status: Former    Types: Cigarettes   Smokeless tobacco: Current    Types: Snuff  Vaping Use   Vaping Use: Never used  Substance and Sexual Activity   Alcohol use: Yes    Comment: 4 times daily   Drug use: No  Types: Other-see comments    Comment: unknown   Sexual activity: Not on file  Other Topics Concern   Not on file  Social History Narrative   Not on file   Social Determinants of Health   Financial Resource Strain: Not on file  Food Insecurity: Not on file  Transportation Needs: Not on file  Physical Activity: Not on file  Stress: Not on file  Social Connections: Not on file  Intimate Partner Violence: Not on file    Review of Systems: See HPI, otherwise negative ROS  Physical Exam: BP 132/82   Pulse 90   Temp 97.6 F (36.4 C) (Temporal)   Resp 16   Ht '5\' 6"'$  (1.676 m)   Wt 63.5 kg   SpO2 100%   BMI 22.60 kg/m  General:   Alert,  pleasant and cooperative in  NAD Head:  Normocephalic and atraumatic. Neck:  Supple; no masses or thyromegaly. Lungs:  Clear throughout to auscultation.    Heart:  Regular rate and rhythm. Abdomen:  Soft, nontender and nondistended. Normal bowel sounds, without guarding, and without rebound.   Neurologic:  Alert and  oriented x4;  grossly normal neurologically.  Impression/Plan: [name] is here for an endoscopy and colonoscopy to be performed for melena and a history of adenomatous polyps with last colonoscopy being in 2018.  Risks, benefits, limitations, and alternatives regarding  endoscopy and colonoscopy have been reviewed with the patient.  Questions have been answered.  All parties agreeable.   Alan Lame, MD  12/17/2021, 7:09 AM

## 2021-12-17 NOTE — Anesthesia Postprocedure Evaluation (Signed)
Anesthesia Post Note  Patient: Alan Brock  Procedure(s) Performed: COLONOSCOPY WITH PROPOFOL ESOPHAGOGASTRODUODENOSCOPY (EGD) WITH PROPOFOL  Patient location during evaluation: PACU Anesthesia Type: General Level of consciousness: awake and alert Pain management: pain level controlled Vital Signs Assessment: post-procedure vital signs reviewed and stable Respiratory status: spontaneous breathing, nonlabored ventilation, respiratory function stable and patient connected to nasal cannula oxygen Cardiovascular status: blood pressure returned to baseline and stable Postop Assessment: no apparent nausea or vomiting Anesthetic complications: no   No notable events documented.   Last Vitals:  Vitals:   12/17/21 0822 12/17/21 0832  BP: (!) 104/57 119/61  Pulse: 69 63  Resp: 14 10  Temp:    SpO2: 100% 100%    Last Pain:  Vitals:   12/17/21 0832  TempSrc:   PainSc: 0-No pain                 Molli Barrows

## 2021-12-17 NOTE — Op Note (Signed)
[facility] Patient Name: Alan Brock Procedure Date: 12/17/2021 7:31 AM MRN: [id number] Account #: [id number] Date of Birth: 1946-05-27 Admit Type: Outpatient Age: 76 Room: [facility] ENDO ROOM 4 Gender: Male Note Status: Finalized Instrument Name: Upper Endoscope [id number] Procedure:             Upper GI endoscopy Indications:           Melena Providers:             Lucilla Lame MD, MD Referring MD:          Leonie Douglas. Doy Hutching, MD (Referring MD) Medicines:             Propofol per Anesthesia Complications:         No immediate complications. Procedure:             Pre-Anesthesia Assessment:                        - Prior to the procedure, a History and Physical was                         performed, and patient medications and allergies were                         reviewed. The patient's tolerance of previous                         anesthesia was also reviewed. The risks and benefits                         of the procedure and the sedation options and risks                         were discussed with the patient. All questions were                         answered, and informed consent was obtained. Prior                         Anticoagulants: The patient has taken no previous                         anticoagulant or antiplatelet agents. ASA Grade                         Assessment: II - A patient with mild systemic disease.                         After reviewing the risks and benefits, the patient                         was deemed in satisfactory condition to undergo the                         procedure.                        After obtaining informed consent, the endoscope was  passed under direct vision. Throughout the procedure,                         the patient's blood pressure, pulse, and oxygen                         saturations were monitored continuously. The Endoscope                         was introduced  through the mouth, and advanced to the                         second part of duodenum. The upper GI endoscopy was                         accomplished without difficulty. The patient tolerated                         the procedure well. Findings:      A small hiatal hernia was present.      The entire examined stomach was normal.      A few angiodysplastic lesions without bleeding were found in the second       portion of the duodenum. Coagulation for tissue destruction using argon       plasma at 2 liters/minute and 30 watts was successful. Impression:            - Small hiatal hernia.                        - Normal stomach.                        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 Treated with argon plasma coagulation (APC).                        - No specimens collected. Recommendation:        - Discharge patient to home.                        - Resume previous diet.                        - Continue present medications.                        - Perform a colonoscopy. Procedure Code(s):     --- Professional ---                        [phone], Esophagogastroduodenoscopy, flexible,                         transoral; with ablation of tumor(s), polyp(s), or                         other lesion(s) (includes pre- and post-dilation and  guide wire passage, when performed) Diagnosis Code(s):     --- Professional ---                        K92.1, Melena (includes Hematochezia)                        K31.819, Angiodysplasia of stomach and duodenum                         without bleeding CPT copyright 2019 American [employer] Association. All rights reserved. The codes documented in this report are preliminary and upon coder review may  be revised to meet current compliance requirements. Lucilla Lame MD, MD 12/17/2021 7:53:24 AM This report has been signed electronically. Number of Addenda: 0 Note Initiated On: 12/17/2021  7:31 AM Estimated Blood Loss:  Estimated blood loss: none.      [facility]

## 2021-12-17 NOTE — Anesthesia Preprocedure Evaluation (Signed)
Anesthesia Evaluation  Patient identified by MRN, date of birth, ID band Patient awake    Reviewed: Allergy & Precautions, H&P , NPO status , Patient's Chart, lab work & pertinent test results, reviewed documented beta blocker date and time   Airway Mallampati: II   Neck ROM: full    Dental  (+) Poor Dentition   Pulmonary neg pulmonary ROS, Patient abstained from smoking., former smoker,    Pulmonary exam normal        Cardiovascular hypertension, negative cardio ROS Normal cardiovascular exam Rhythm:regular Rate:Normal     Neuro/Psych negative neurological ROS  negative psych ROS   GI/Hepatic negative GI ROS, Neg liver ROS,   Endo/Other  negative endocrine ROS  Renal/GU negative Renal ROS  negative genitourinary   Musculoskeletal   Abdominal   Peds  Hematology negative hematology ROS (+)   Anesthesia Other Findings Past Medical History: No date: Adenomatous polyps No date: BPH (benign prostatic hyperplasia) No date: Cancer (HCC)     Comment:  SKIN No date: DDD (degenerative disc disease), cervical No date: Elevated PSA No date: Hyperlipidemia No date: Hypertension Past Surgical History: No date: ADENOIDECTOMY No date: APPENDECTOMY 02/02/2018: CATARACT EXTRACTION W/PHACO; Left     Comment:  Procedure: CATARACT EXTRACTION PHACO AND INTRAOCULAR               LENS PLACEMENT (IOC);  Surgeon: Birder Robson, MD;                Location: [facility] ORS;  Service: Ophthalmology;  Laterality:              Left;  [place] 00:35.6 AP% 14.4 CDE 5.12 Fluid pack lot #               [id number] No date: COLONOSCOPY 12/03/2016: COLONOSCOPY WITH PROPOFOL; N/A     Comment:  Procedure: COLONOSCOPY WITH PROPOFOL;  Surgeon: Manya Silvas, MD;  Location: [facility] ENDOSCOPY;  Service:               Endoscopy;  Laterality: N/A; No date: ESOPHAGOGASTRODUODENOSCOPY No date: FLEXIBLE SIGMOIDOSCOPY 03/10/2021: HIP ARTHROPLASTY;  Right     Comment:  Procedure: ARTHROPLASTY BIPOLAR HIP (HEMIARTHROPLASTY);               Surgeon: Earnestine Leys, MD;  Location: [facility] ORS;                Service: Orthopedics;  Laterality: Right; BMI    Body Mass Index: 22.60 kg/m     Reproductive/Obstetrics negative OB ROS                             Anesthesia Physical Anesthesia Plan  ASA: 3  Anesthesia Plan: General   Post-op Pain Management:    Induction:   PONV Risk Score and Plan:   Airway Management Planned:   Additional Equipment:   Intra-op Plan:   Post-operative Plan:   Informed Consent: I have reviewed the patients History and Physical, chart, labs and discussed the procedure including the risks, benefits and alternatives for the proposed anesthesia with the patient or authorized representative who has indicated his/her understanding and acceptance.     Dental Advisory Given  Plan Discussed with: CRNA  Anesthesia Plan Comments:         Anesthesia Quick Evaluation

## 2021-12-18 ENCOUNTER — Encounter: Payer: Self-pay | Admitting: Gastroenterology

## 2022-01-09 DIAGNOSIS — E538 Deficiency of other specified B group vitamins: Secondary | ICD-10-CM | POA: Diagnosis not present

## 2022-01-22 DIAGNOSIS — Z125 Encounter for screening for malignant neoplasm of prostate: Secondary | ICD-10-CM | POA: Diagnosis not present

## 2022-01-22 DIAGNOSIS — E538 Deficiency of other specified B group vitamins: Secondary | ICD-10-CM | POA: Diagnosis not present

## 2022-01-22 DIAGNOSIS — Z0000 Encounter for general adult medical examination without abnormal findings: Secondary | ICD-10-CM | POA: Diagnosis not present

## 2022-01-22 DIAGNOSIS — M5030 Other cervical disc degeneration, unspecified cervical region: Secondary | ICD-10-CM | POA: Diagnosis not present

## 2022-01-22 DIAGNOSIS — E7800 Pure hypercholesterolemia, unspecified: Secondary | ICD-10-CM | POA: Diagnosis not present

## 2022-01-22 DIAGNOSIS — Z79899 Other long term (current) drug therapy: Secondary | ICD-10-CM | POA: Diagnosis not present

## 2022-01-22 DIAGNOSIS — I10 Essential (primary) hypertension: Secondary | ICD-10-CM | POA: Diagnosis not present

## 2022-01-29 DIAGNOSIS — H2511 Age-related nuclear cataract, right eye: Secondary | ICD-10-CM | POA: Diagnosis not present

## 2022-01-29 DIAGNOSIS — H43813 Vitreous degeneration, bilateral: Secondary | ICD-10-CM | POA: Diagnosis not present

## 2022-01-29 DIAGNOSIS — Z961 Presence of intraocular lens: Secondary | ICD-10-CM | POA: Diagnosis not present

## 2022-01-29 DIAGNOSIS — M3501 Sicca syndrome with keratoconjunctivitis: Secondary | ICD-10-CM | POA: Diagnosis not present

## 2022-02-10 DIAGNOSIS — E538 Deficiency of other specified B group vitamins: Secondary | ICD-10-CM | POA: Diagnosis not present

## 2022-03-13 DIAGNOSIS — E538 Deficiency of other specified B group vitamins: Secondary | ICD-10-CM | POA: Diagnosis not present

## 2022-03-18 DIAGNOSIS — I10 Essential (primary) hypertension: Secondary | ICD-10-CM | POA: Diagnosis not present

## 2022-03-18 DIAGNOSIS — M5442 Lumbago with sciatica, left side: Secondary | ICD-10-CM | POA: Diagnosis not present

## 2022-03-18 DIAGNOSIS — M5440 Lumbago with sciatica, unspecified side: Secondary | ICD-10-CM | POA: Diagnosis not present

## 2022-03-21 ENCOUNTER — Other Ambulatory Visit: Payer: Self-pay | Admitting: Physician Assistant

## 2022-03-21 DIAGNOSIS — I10 Essential (primary) hypertension: Secondary | ICD-10-CM | POA: Diagnosis not present

## 2022-03-21 DIAGNOSIS — M5416 Radiculopathy, lumbar region: Secondary | ICD-10-CM

## 2022-03-21 DIAGNOSIS — M5136 Other intervertebral disc degeneration, lumbar region: Secondary | ICD-10-CM | POA: Diagnosis not present

## 2022-03-22 ENCOUNTER — Ambulatory Visit
Admission: RE | Admit: 2022-03-22 | Discharge: 2022-03-22 | Disposition: A | Discharge status: Home / Self Care | Payer: PPO | Source: Ambulatory Visit | Attending: Physician Assistant | Admitting: Physician Assistant

## 2022-03-22 DIAGNOSIS — M48061 Spinal stenosis, lumbar region without neurogenic claudication: Secondary | ICD-10-CM | POA: Diagnosis not present

## 2022-03-22 DIAGNOSIS — M5416 Radiculopathy, lumbar region: Secondary | ICD-10-CM | POA: Diagnosis not present

## 2022-03-22 DIAGNOSIS — M47816 Spondylosis without myelopathy or radiculopathy, lumbar region: Secondary | ICD-10-CM | POA: Diagnosis not present

## 2022-03-24 DIAGNOSIS — I10 Essential (primary) hypertension: Secondary | ICD-10-CM | POA: Diagnosis not present

## 2022-03-24 DIAGNOSIS — M5442 Lumbago with sciatica, left side: Secondary | ICD-10-CM | POA: Diagnosis not present

## 2022-03-25 DIAGNOSIS — M48062 Spinal stenosis, lumbar region with neurogenic claudication: Secondary | ICD-10-CM | POA: Diagnosis not present

## 2022-03-25 DIAGNOSIS — M5442 Lumbago with sciatica, left side: Secondary | ICD-10-CM | POA: Diagnosis not present

## 2022-03-28 DIAGNOSIS — M5416 Radiculopathy, lumbar region: Secondary | ICD-10-CM | POA: Diagnosis not present

## 2022-03-28 DIAGNOSIS — M5126 Other intervertebral disc displacement, lumbar region: Secondary | ICD-10-CM | POA: Diagnosis not present

## 2022-04-14 DIAGNOSIS — E538 Deficiency of other specified B group vitamins: Secondary | ICD-10-CM | POA: Diagnosis not present

## 2022-04-17 DIAGNOSIS — E7800 Pure hypercholesterolemia, unspecified: Secondary | ICD-10-CM | POA: Diagnosis not present

## 2022-04-17 DIAGNOSIS — Z79899 Other long term (current) drug therapy: Secondary | ICD-10-CM | POA: Diagnosis not present

## 2022-04-17 DIAGNOSIS — I10 Essential (primary) hypertension: Secondary | ICD-10-CM | POA: Diagnosis not present

## 2022-04-17 DIAGNOSIS — Z125 Encounter for screening for malignant neoplasm of prostate: Secondary | ICD-10-CM | POA: Diagnosis not present

## 2022-04-23 DIAGNOSIS — M5136 Other intervertebral disc degeneration, lumbar region: Secondary | ICD-10-CM | POA: Diagnosis not present

## 2022-04-23 DIAGNOSIS — M5416 Radiculopathy, lumbar region: Secondary | ICD-10-CM | POA: Diagnosis not present

## 2022-04-23 DIAGNOSIS — M5126 Other intervertebral disc displacement, lumbar region: Secondary | ICD-10-CM | POA: Diagnosis not present

## 2022-04-23 DIAGNOSIS — M48062 Spinal stenosis, lumbar region with neurogenic claudication: Secondary | ICD-10-CM | POA: Diagnosis not present

## 2022-04-24 DIAGNOSIS — E538 Deficiency of other specified B group vitamins: Secondary | ICD-10-CM | POA: Diagnosis not present

## 2022-04-24 DIAGNOSIS — M5030 Other cervical disc degeneration, unspecified cervical region: Secondary | ICD-10-CM | POA: Diagnosis not present

## 2022-04-24 DIAGNOSIS — I10 Essential (primary) hypertension: Secondary | ICD-10-CM | POA: Diagnosis not present

## 2022-04-24 DIAGNOSIS — Z125 Encounter for screening for malignant neoplasm of prostate: Secondary | ICD-10-CM | POA: Diagnosis not present

## 2022-04-24 DIAGNOSIS — E7800 Pure hypercholesterolemia, unspecified: Secondary | ICD-10-CM | POA: Diagnosis not present

## 2022-05-14 DIAGNOSIS — M5126 Other intervertebral disc displacement, lumbar region: Secondary | ICD-10-CM | POA: Diagnosis not present

## 2022-05-14 DIAGNOSIS — M5416 Radiculopathy, lumbar region: Secondary | ICD-10-CM | POA: Diagnosis not present

## 2022-05-15 DIAGNOSIS — E538 Deficiency of other specified B group vitamins: Secondary | ICD-10-CM | POA: Diagnosis not present

## 2022-06-04 DIAGNOSIS — Z125 Encounter for screening for malignant neoplasm of prostate: Secondary | ICD-10-CM | POA: Diagnosis not present

## 2022-06-04 DIAGNOSIS — M48062 Spinal stenosis, lumbar region with neurogenic claudication: Secondary | ICD-10-CM | POA: Diagnosis not present

## 2022-06-04 DIAGNOSIS — M5442 Lumbago with sciatica, left side: Secondary | ICD-10-CM | POA: Diagnosis not present

## 2022-07-21 DIAGNOSIS — E538 Deficiency of other specified B group vitamins: Secondary | ICD-10-CM | POA: Diagnosis not present

## 2022-07-30 DIAGNOSIS — Z125 Encounter for screening for malignant neoplasm of prostate: Secondary | ICD-10-CM | POA: Diagnosis not present

## 2022-07-30 DIAGNOSIS — N401 Enlarged prostate with lower urinary tract symptoms: Secondary | ICD-10-CM | POA: Diagnosis not present

## 2022-07-30 DIAGNOSIS — R55 Syncope and collapse: Secondary | ICD-10-CM | POA: Diagnosis not present

## 2022-07-30 DIAGNOSIS — I10 Essential (primary) hypertension: Secondary | ICD-10-CM | POA: Diagnosis not present

## 2022-07-30 DIAGNOSIS — E538 Deficiency of other specified B group vitamins: Secondary | ICD-10-CM | POA: Diagnosis not present

## 2022-07-30 DIAGNOSIS — E7800 Pure hypercholesterolemia, unspecified: Secondary | ICD-10-CM | POA: Diagnosis not present

## 2022-07-30 DIAGNOSIS — R002 Palpitations: Secondary | ICD-10-CM | POA: Diagnosis not present

## 2022-07-30 DIAGNOSIS — M5030 Other cervical disc degeneration, unspecified cervical region: Secondary | ICD-10-CM | POA: Diagnosis not present

## 2022-07-30 DIAGNOSIS — N138 Other obstructive and reflux uropathy: Secondary | ICD-10-CM | POA: Diagnosis not present

## 2022-07-30 DIAGNOSIS — Z79899 Other long term (current) drug therapy: Secondary | ICD-10-CM | POA: Diagnosis not present

## 2022-08-13 DIAGNOSIS — R55 Syncope and collapse: Secondary | ICD-10-CM | POA: Diagnosis not present

## 2022-08-13 DIAGNOSIS — I6523 Occlusion and stenosis of bilateral carotid arteries: Secondary | ICD-10-CM | POA: Diagnosis not present

## 2022-08-13 DIAGNOSIS — R002 Palpitations: Secondary | ICD-10-CM | POA: Diagnosis not present

## 2022-08-22 DIAGNOSIS — E538 Deficiency of other specified B group vitamins: Secondary | ICD-10-CM | POA: Diagnosis not present

## 2022-09-22 DIAGNOSIS — E538 Deficiency of other specified B group vitamins: Secondary | ICD-10-CM | POA: Diagnosis not present

## 2022-09-27 IMAGING — CR DG CHEST 1V PORT
1 series · 1 of 1 positions shown · non-contrast
Comparison: None.

CLINICAL DATA: Right hip fracture.

EXAM:
PORTABLE CHEST 1 VIEW

[dg chest port 1 view]
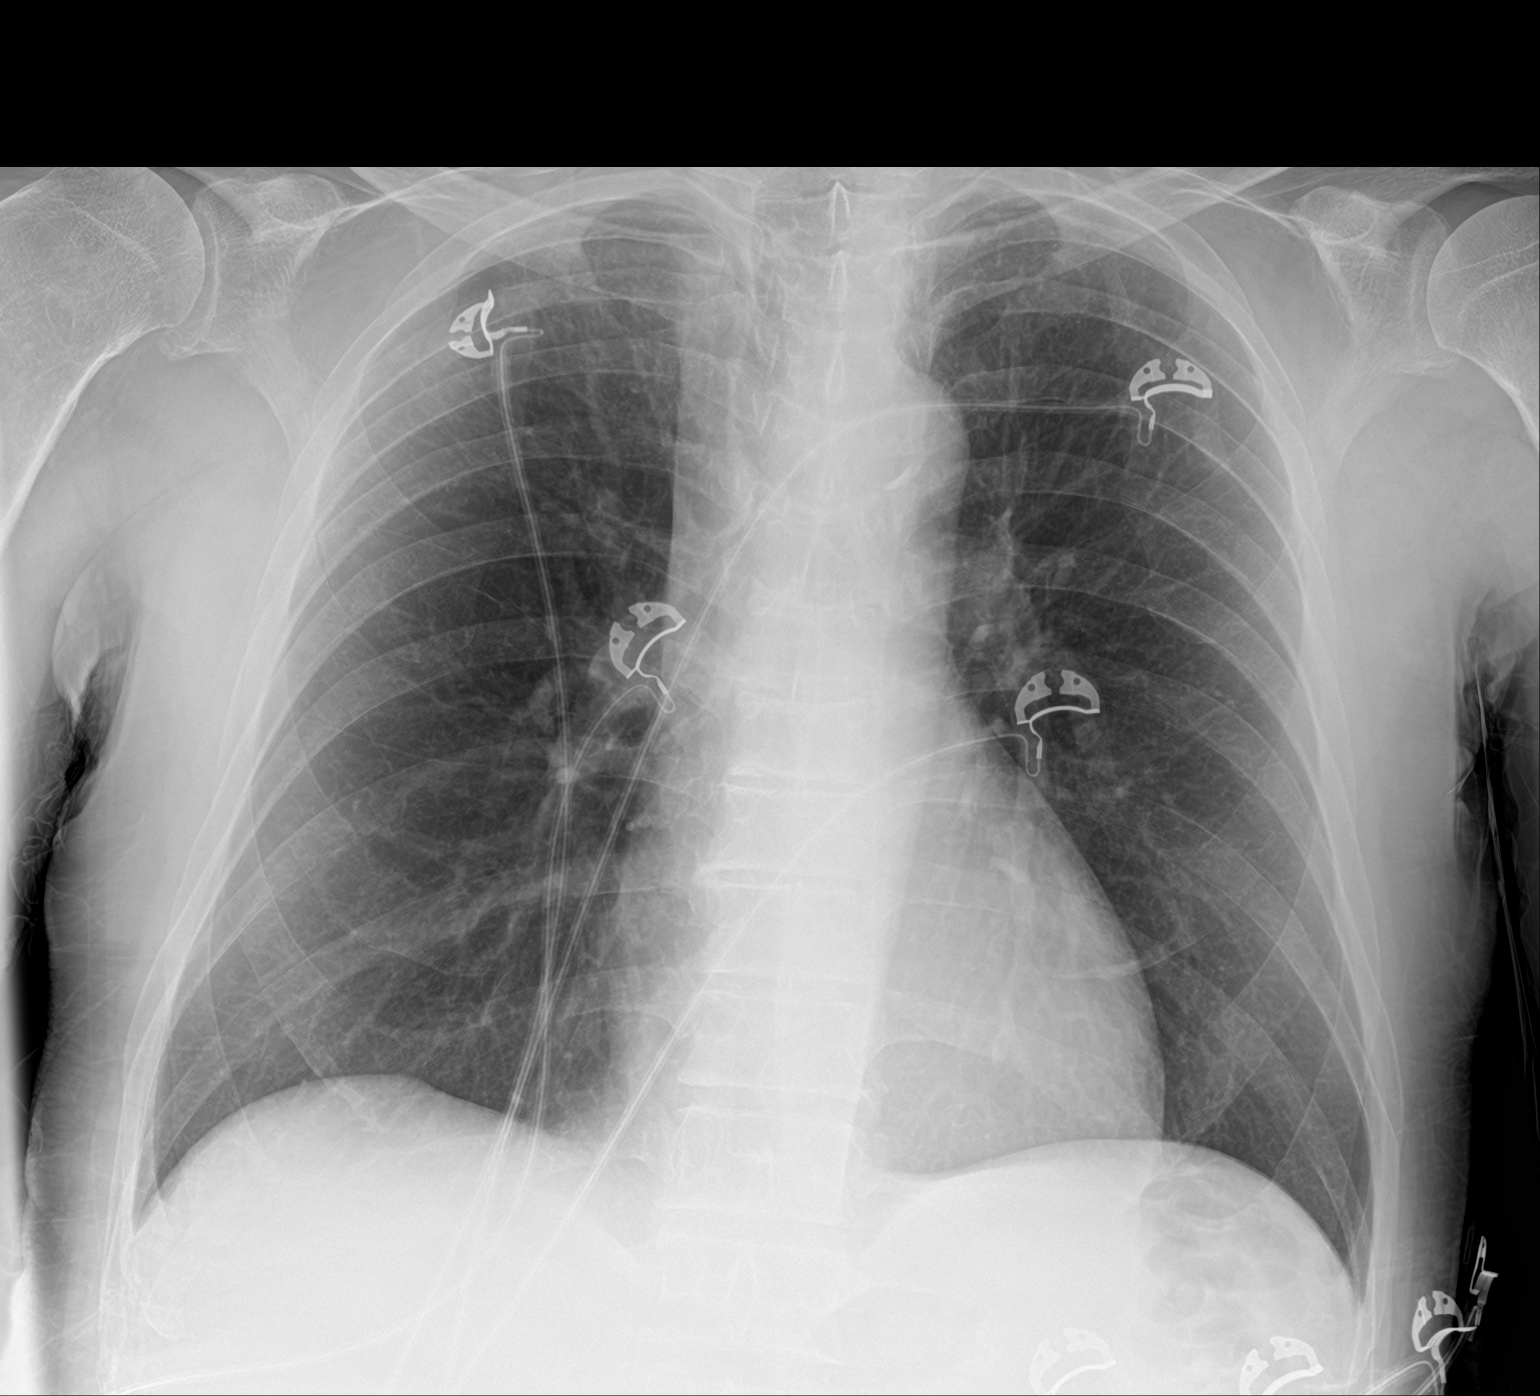

[1 of 1 positions shown; findings below may reference images not displayed]

FINDINGS: The heart size and mediastinal contours are within normal limits.
Both lungs are clear. The visualized skeletal structures are
unremarkable.
IMPRESSION: No active disease.

Aortic Atherosclerosis ([id number]).

## 2022-10-22 DIAGNOSIS — E7800 Pure hypercholesterolemia, unspecified: Secondary | ICD-10-CM | POA: Diagnosis not present

## 2022-10-22 DIAGNOSIS — Z125 Encounter for screening for malignant neoplasm of prostate: Secondary | ICD-10-CM | POA: Diagnosis not present

## 2022-10-22 DIAGNOSIS — E538 Deficiency of other specified B group vitamins: Secondary | ICD-10-CM | POA: Diagnosis not present

## 2022-10-22 DIAGNOSIS — Z79899 Other long term (current) drug therapy: Secondary | ICD-10-CM | POA: Diagnosis not present

## 2022-10-22 DIAGNOSIS — I10 Essential (primary) hypertension: Secondary | ICD-10-CM | POA: Diagnosis not present

## 2022-10-29 DIAGNOSIS — E538 Deficiency of other specified B group vitamins: Secondary | ICD-10-CM | POA: Diagnosis not present

## 2022-10-29 DIAGNOSIS — M5030 Other cervical disc degeneration, unspecified cervical region: Secondary | ICD-10-CM | POA: Diagnosis not present

## 2022-10-29 DIAGNOSIS — I10 Essential (primary) hypertension: Secondary | ICD-10-CM | POA: Diagnosis not present

## 2022-10-29 DIAGNOSIS — N401 Enlarged prostate with lower urinary tract symptoms: Secondary | ICD-10-CM | POA: Diagnosis not present

## 2022-10-29 DIAGNOSIS — N138 Other obstructive and reflux uropathy: Secondary | ICD-10-CM | POA: Diagnosis not present

## 2022-10-29 DIAGNOSIS — E7800 Pure hypercholesterolemia, unspecified: Secondary | ICD-10-CM | POA: Diagnosis not present

## 2022-10-29 DIAGNOSIS — R972 Elevated prostate specific antigen [PSA]: Secondary | ICD-10-CM | POA: Diagnosis not present

## 2022-11-13 ENCOUNTER — Ambulatory Visit: Payer: PPO | Admitting: Urology

## 2022-11-13 ENCOUNTER — Encounter: Payer: Self-pay | Admitting: Urology

## 2022-11-13 VITALS — BP 123/69 | HR 84 | Ht 66.0 in | Wt 146.0 lb

## 2022-11-13 DIAGNOSIS — N401 Enlarged prostate with lower urinary tract symptoms: Secondary | ICD-10-CM

## 2022-11-13 DIAGNOSIS — Z87898 Personal history of other specified conditions: Secondary | ICD-10-CM

## 2022-11-13 DIAGNOSIS — R972 Elevated prostate specific antigen [PSA]: Secondary | ICD-10-CM

## 2022-11-13 LAB — URINALYSIS, COMPLETE
Bilirubin, UA: NEGATIVE
Glucose, UA: NEGATIVE
Ketones, UA: NEGATIVE
Leukocytes,UA: NEGATIVE
Nitrite, UA: NEGATIVE
Protein,UA: NEGATIVE
Specific Gravity, UA: >1.03 — ABNORMAL HIGH (ref 1.005–1.030)
Urobilinogen, Ur: 0.2 mg/dL (ref 0.2–1.0)
pH, UA: 5 (ref 5.0–7.5)

## 2022-11-13 LAB — MICROSCOPIC EXAMINATION

## 2022-11-13 NOTE — Progress Notes (Signed)
I,Amy L Pierron,acting as a scribe for Riki Altes, MD.,have documented all relevant documentation on the behalf of Riki Altes, MD,as directed by  Riki Altes, MD while in the presence of Riki Altes, MD.  11/13/2022 3:25 PM   Darnelle Spangle 1945/08/25 [id number]  Referring provider: Marguarite Arbour, MD [address] [facility] [place],  [place] [address]  Chief Complaint  Patient presents with   Elevated PSA    HPI: Alan Brock is a 77 y.o. male referred for evaluation of elevated PSA.  Last PSA drawn 10/22/22 was 2.59. Prior PSA levels on 06/04/22 was 3.68 and on 04/17/22 was 4.45. Has a friend in his 39's recently diagnosed with prostate cancer so he requested a urology referral because of his recent PSA bump.  He is on Tamsulosin 0.4 milligrams daily.  Denies bothersome lower urinary tract symptoms. No dysuria, gross hematuria, and no flank, abdominal, or pelvic pain.   PMH: Past Medical History:  Diagnosis Date   Adenomatous polyps    BPH (benign prostatic hyperplasia)    Cancer (HCC)    SKIN   DDD (degenerative disc disease), cervical    Elevated PSA    Hyperlipidemia    Hypertension     Surgical History: Past Surgical History:  Procedure Laterality Date   ADENOIDECTOMY     APPENDECTOMY     CATARACT EXTRACTION W/PHACO Left 02/02/2018   Procedure: CATARACT EXTRACTION PHACO AND INTRAOCULAR LENS PLACEMENT (IOC);  Surgeon: Galen Manila, MD;  Location: [facility] ORS;  Service: Ophthalmology;  Laterality: Left;  [place] 00:35.6 AP% 14.4 CDE 5.12 Fluid pack lot # [id number]   COLONOSCOPY     COLONOSCOPY WITH PROPOFOL N/A 12/03/2016   Procedure: COLONOSCOPY WITH PROPOFOL;  Surgeon: Scot Jun, MD;  Location: [facility] ENDOSCOPY;  Service: Endoscopy;  Laterality: N/A;   COLONOSCOPY WITH PROPOFOL N/A 12/17/2021   Procedure: COLONOSCOPY WITH PROPOFOL;  Surgeon: Midge Minium, MD;  Location: [facility] ENDOSCOPY;  Service: Endoscopy;  Laterality: N/A;    ESOPHAGOGASTRODUODENOSCOPY     ESOPHAGOGASTRODUODENOSCOPY (EGD) WITH PROPOFOL  12/17/2021   Procedure: ESOPHAGOGASTRODUODENOSCOPY (EGD) WITH PROPOFOL;  Surgeon: Midge Minium, MD;  Location: [facility] ENDOSCOPY;  Service: Endoscopy;;   FLEXIBLE SIGMOIDOSCOPY     HIP ARTHROPLASTY Right 03/10/2021   Procedure: ARTHROPLASTY BIPOLAR HIP (HEMIARTHROPLASTY);  Surgeon: Deeann Saint, MD;  Location: [facility] ORS;  Service: Orthopedics;  Laterality: Right;    Home Medications:  Allergies as of 11/13/2022   No Known Allergies      Medication List        Accurate as of Nov 13, 2022  3:25 PM. If you have any questions, ask your nurse or doctor.          STOP taking these medications    enoxaparin 30 MG/0.3ML injection Commonly known as: LOVENOX Stopped by: Riki Altes, MD   feeding supplement Liqd Stopped by: Riki Altes, MD   folic acid 1 MG tablet Commonly known as: FOLVITE Stopped by: Riki Altes, MD   multivitamin with minerals Tabs tablet Stopped by: Riki Altes, MD   polyethylene glycol 17 g packet Commonly known as: MIRALAX / GLYCOLAX Stopped by: Riki Altes, MD   thiamine 100 MG tablet Commonly known as: VITAMIN B1 Stopped by: Riki Altes, MD       TAKE these medications    aspirin 81 MG chewable tablet Chew 81 mg by mouth daily.   Biotin 10 MG Caps Take 10,000 mcg by  mouth daily at 6 (six) AM.   gabapentin 300 MG capsule Commonly known as: NEURONTIN Take 300 mg by mouth at bedtime.   tamsulosin 0.4 MG Caps capsule Commonly known as: FLOMAX Take by mouth.        Family History: Family History  Problem Relation Age of Onset   Heart attack Mother    Heart attack Father     Social History:  reports that he has quit smoking. His smoking use included cigarettes. His smokeless tobacco use includes snuff. He reports current alcohol use. He reports that he does not use drugs.   Physical Exam: BP 123/69   Pulse 84   Ht 5\' 6"  (1.676 m)    Wt 146 lb (66.2 kg)   BMI 23.57 kg/m   Constitutional:  Alert and oriented, No acute distress. HEENT: [place] AT Respiratory: Normal respiratory effort, no increased work of breathing. GI: Abdomen is soft, nontender, nondistended, no abdominal masses GU: Prostate 50 grams; smooth without nodules; normal consistency. Skin: No rashes, bruises or suspicious lesions. Neurologic: Grossly intact, no focal deficits, moving all 4 extremities. Psychiatric: Normal mood and affect.   Assessment & Plan:    History of elevated PSA Benign DRE and recent PSA normal at 2.59. Discussed the current prostate cancer screening guidelines which do not recommend PSA screening after age 46. Also, the chances of him developing clinically significant prostate cancer are unlikely.  Offered him annual visit for DRE and he would like to continue annual prostate checks.   2. BPH with LUTS Stable lower urinary tract symptoms. On Tamsulosin, which is prescribed by Dr. Judithann Sheen.  I have reviewed the above documentation for accuracy and completeness, and I agree with the above.   Riki Altes, MD  [facility] [address] [place], [place] [address] [phone]

## 2022-11-14 ENCOUNTER — Telehealth: Payer: Self-pay | Admitting: Urology

## 2022-11-14 DIAGNOSIS — R3129 Other microscopic hematuria: Secondary | ICD-10-CM

## 2022-11-14 NOTE — Addendum Note (Signed)
Addended by: Frankey Shown on: 11/14/2022 03:36 PM   Modules accepted: Orders

## 2022-11-14 NOTE — Telephone Encounter (Signed)
Please let patient know his urinalysis yesterday showed an abnormal amount of microscopic blood in the urine.  This was also present on AUA from [facility] April 2023.  The recommended evaluation for microscopic blood consist of a CT urogram and cystoscopy which I would recommend scheduling to evaluate for abnormalities including urologic cancer.  Please let me know if he has any questions

## 2022-11-14 NOTE — Telephone Encounter (Signed)
Pt calls triage line, informed pt of the information below per Dr. Lonna Cobb. Pt voiced understanding. Appt for cysto scheduled. CT scan ordered.

## 2022-11-18 ENCOUNTER — Ambulatory Visit
Admission: RE | Admit: 2022-11-18 | Discharge: 2022-11-18 | Disposition: A | Discharge status: Home / Self Care | Payer: PPO | Source: Ambulatory Visit | Attending: Urology | Admitting: Urology

## 2022-11-18 DIAGNOSIS — R3129 Other microscopic hematuria: Secondary | ICD-10-CM | POA: Insufficient documentation

## 2022-11-18 DIAGNOSIS — R319 Hematuria, unspecified: Secondary | ICD-10-CM | POA: Diagnosis not present

## 2022-11-18 DIAGNOSIS — K7689 Other specified diseases of liver: Secondary | ICD-10-CM | POA: Diagnosis not present

## 2022-11-18 MED ORDER — IOHEXOL 300 MG/ML  SOLN
100.0000 mL | Freq: Once | INTRAMUSCULAR | Status: AC | PRN
Start: 2022-11-18 — End: 2022-11-18
  Administered 2022-11-18: 100 mL via INTRAVENOUS

## 2022-11-24 ENCOUNTER — Telehealth: Payer: Self-pay | Admitting: *Deleted

## 2022-11-24 NOTE — Telephone Encounter (Signed)
-----   Message from Riki Altes, MD sent at 11/21/2022  2:46 PM EDT ----- CT showed no renal stones, mass.  Keep cystoscopy appointment for evaluation of prostate/bladder

## 2022-11-24 NOTE — Telephone Encounter (Signed)
Notified patient as instructed, patient pleased. Discussed follow-up appointments, patient agrees  

## 2022-12-01 DIAGNOSIS — E538 Deficiency of other specified B group vitamins: Secondary | ICD-10-CM | POA: Diagnosis not present

## 2022-12-04 ENCOUNTER — Ambulatory Visit (INDEPENDENT_AMBULATORY_CARE_PROVIDER_SITE_OTHER): Payer: PPO | Admitting: Urology

## 2022-12-04 ENCOUNTER — Encounter: Payer: Self-pay | Admitting: Urology

## 2022-12-04 VITALS — BP 123/76 | HR 76 | Ht 70.0 in | Wt 146.0 lb

## 2022-12-04 DIAGNOSIS — R3129 Other microscopic hematuria: Secondary | ICD-10-CM | POA: Diagnosis not present

## 2022-12-04 LAB — URINALYSIS, COMPLETE
Bilirubin, UA: NEGATIVE
Glucose, UA: NEGATIVE
Ketones, UA: NEGATIVE
Leukocytes,UA: NEGATIVE
Nitrite, UA: NEGATIVE
Protein,UA: NEGATIVE
Specific Gravity, UA: <1.005 — ABNORMAL LOW (ref 1.005–1.030)
Urobilinogen, Ur: 0.2 mg/dL (ref 0.2–1.0)
pH, UA: 6 (ref 5.0–7.5)

## 2022-12-04 LAB — MICROSCOPIC EXAMINATION

## 2022-12-04 NOTE — Progress Notes (Signed)
   12/04/22  CC:  Chief Complaint  Patient presents with   Cysto    HPI: Seen 11/13/2022 for a PSA of 4.45 with a follow-up PSA normal at 2.59.  Noted at that appointment to have 3-10 RBCs on urinalysis.  CTU performed 11/18/2022 showed prostate enlargement.  Incomplete opacification of the right and left ureter noted.  UA today shows no microscopic blood  Blood pressure 123/76, pulse 76, height 5\' 10"  (1.778 m), weight 146 lb (66.2 kg). NED. A&Ox3.   No respiratory distress   Abd soft, NT, ND Normal phallus with bilateral descended testicles  Cystoscopy Procedure Note  Patient identification was confirmed, informed consent was obtained, and patient was prepped using Betadine solution.  Lidocaine jelly was administered per urethral meatus.     Pre-Procedure: - Inspection reveals a normal caliber urethral meatus.  Procedure: The flexible cystoscope was introduced without difficulty - No urethral strictures/lesions are present. -Moderate lateral lobe enlargement prostate with hypervascularity -Mild elevation bladder neck - Bilateral ureteral orifices identified - Bladder mucosa  reveals no ulcers, tumors, or lesions - No bladder stones - Mild trabeculation  Retroflexion shows small intravesical median lobe   Post-Procedure: - Patient tolerated the procedure well  Assessment/ Plan: No bladder mucosal abnormalities on cystoscopy No significant abnormalities on CTU Urinalysis today clear Keep scheduled 1 year follow-up for prostate check   Riki Altes, MD

## 2023-01-01 DIAGNOSIS — E538 Deficiency of other specified B group vitamins: Secondary | ICD-10-CM | POA: Diagnosis not present

## 2023-01-30 DIAGNOSIS — Z961 Presence of intraocular lens: Secondary | ICD-10-CM | POA: Diagnosis not present

## 2023-01-30 DIAGNOSIS — H43813 Vitreous degeneration, bilateral: Secondary | ICD-10-CM | POA: Diagnosis not present

## 2023-01-30 DIAGNOSIS — H26492 Other secondary cataract, left eye: Secondary | ICD-10-CM | POA: Diagnosis not present

## 2023-01-30 DIAGNOSIS — H2511 Age-related nuclear cataract, right eye: Secondary | ICD-10-CM | POA: Diagnosis not present

## 2023-02-02 DIAGNOSIS — E538 Deficiency of other specified B group vitamins: Secondary | ICD-10-CM | POA: Diagnosis not present

## 2023-02-11 DIAGNOSIS — E538 Deficiency of other specified B group vitamins: Secondary | ICD-10-CM | POA: Diagnosis not present

## 2023-02-11 DIAGNOSIS — I10 Essential (primary) hypertension: Secondary | ICD-10-CM | POA: Diagnosis not present

## 2023-02-11 DIAGNOSIS — N401 Enlarged prostate with lower urinary tract symptoms: Secondary | ICD-10-CM | POA: Diagnosis not present

## 2023-02-11 DIAGNOSIS — Z0000 Encounter for general adult medical examination without abnormal findings: Secondary | ICD-10-CM | POA: Diagnosis not present

## 2023-02-11 DIAGNOSIS — N138 Other obstructive and reflux uropathy: Secondary | ICD-10-CM | POA: Diagnosis not present

## 2023-02-11 DIAGNOSIS — E7800 Pure hypercholesterolemia, unspecified: Secondary | ICD-10-CM | POA: Diagnosis not present

## 2023-02-11 DIAGNOSIS — Z79899 Other long term (current) drug therapy: Secondary | ICD-10-CM | POA: Diagnosis not present

## 2023-02-11 DIAGNOSIS — R002 Palpitations: Secondary | ICD-10-CM | POA: Diagnosis not present

## 2023-02-18 DIAGNOSIS — Z03818 Encounter for observation for suspected exposure to other biological agents ruled out: Secondary | ICD-10-CM | POA: Diagnosis not present

## 2023-03-30 DIAGNOSIS — E538 Deficiency of other specified B group vitamins: Secondary | ICD-10-CM | POA: Diagnosis not present

## 2023-04-30 DIAGNOSIS — E538 Deficiency of other specified B group vitamins: Secondary | ICD-10-CM | POA: Diagnosis not present

## 2023-05-06 DIAGNOSIS — M5416 Radiculopathy, lumbar region: Secondary | ICD-10-CM | POA: Diagnosis not present

## 2023-05-06 DIAGNOSIS — M5441 Lumbago with sciatica, right side: Secondary | ICD-10-CM | POA: Diagnosis not present

## 2023-05-06 DIAGNOSIS — M5442 Lumbago with sciatica, left side: Secondary | ICD-10-CM | POA: Diagnosis not present

## 2023-05-06 DIAGNOSIS — G8929 Other chronic pain: Secondary | ICD-10-CM | POA: Diagnosis not present

## 2023-05-11 DIAGNOSIS — M5416 Radiculopathy, lumbar region: Secondary | ICD-10-CM | POA: Diagnosis not present

## 2023-05-15 DIAGNOSIS — E538 Deficiency of other specified B group vitamins: Secondary | ICD-10-CM | POA: Diagnosis not present

## 2023-05-15 DIAGNOSIS — N138 Other obstructive and reflux uropathy: Secondary | ICD-10-CM | POA: Diagnosis not present

## 2023-05-15 DIAGNOSIS — Z79899 Other long term (current) drug therapy: Secondary | ICD-10-CM | POA: Diagnosis not present

## 2023-05-15 DIAGNOSIS — N401 Enlarged prostate with lower urinary tract symptoms: Secondary | ICD-10-CM | POA: Diagnosis not present

## 2023-05-15 DIAGNOSIS — I10 Essential (primary) hypertension: Secondary | ICD-10-CM | POA: Diagnosis not present

## 2023-05-15 DIAGNOSIS — E782 Mixed hyperlipidemia: Secondary | ICD-10-CM | POA: Diagnosis not present

## 2023-05-26 DIAGNOSIS — M5441 Lumbago with sciatica, right side: Secondary | ICD-10-CM | POA: Diagnosis not present

## 2023-05-26 DIAGNOSIS — M5416 Radiculopathy, lumbar region: Secondary | ICD-10-CM | POA: Diagnosis not present

## 2023-05-26 DIAGNOSIS — G8929 Other chronic pain: Secondary | ICD-10-CM | POA: Diagnosis not present

## 2023-05-26 DIAGNOSIS — M5442 Lumbago with sciatica, left side: Secondary | ICD-10-CM | POA: Diagnosis not present

## 2023-06-01 DIAGNOSIS — E538 Deficiency of other specified B group vitamins: Secondary | ICD-10-CM | POA: Diagnosis not present

## 2023-07-02 DIAGNOSIS — E538 Deficiency of other specified B group vitamins: Secondary | ICD-10-CM | POA: Diagnosis not present

## 2023-08-17 DIAGNOSIS — Z0000 Encounter for general adult medical examination without abnormal findings: Secondary | ICD-10-CM | POA: Diagnosis not present

## 2023-08-17 DIAGNOSIS — Z1211 Encounter for screening for malignant neoplasm of colon: Secondary | ICD-10-CM | POA: Diagnosis not present

## 2023-08-17 DIAGNOSIS — Z23 Encounter for immunization: Secondary | ICD-10-CM | POA: Diagnosis not present

## 2023-08-17 DIAGNOSIS — E538 Deficiency of other specified B group vitamins: Secondary | ICD-10-CM | POA: Diagnosis not present

## 2023-08-17 DIAGNOSIS — M79641 Pain in right hand: Secondary | ICD-10-CM | POA: Diagnosis not present

## 2023-08-17 DIAGNOSIS — N401 Enlarged prostate with lower urinary tract symptoms: Secondary | ICD-10-CM | POA: Diagnosis not present

## 2023-08-17 DIAGNOSIS — I10 Essential (primary) hypertension: Secondary | ICD-10-CM | POA: Diagnosis not present

## 2023-08-17 DIAGNOSIS — Z79899 Other long term (current) drug therapy: Secondary | ICD-10-CM | POA: Diagnosis not present

## 2023-08-17 DIAGNOSIS — E7800 Pure hypercholesterolemia, unspecified: Secondary | ICD-10-CM | POA: Diagnosis not present

## 2023-08-17 DIAGNOSIS — N138 Other obstructive and reflux uropathy: Secondary | ICD-10-CM | POA: Diagnosis not present

## 2023-08-24 DIAGNOSIS — Z1211 Encounter for screening for malignant neoplasm of colon: Secondary | ICD-10-CM | POA: Diagnosis not present

## 2023-08-26 DIAGNOSIS — M720 Palmar fascial fibromatosis [Dupuytren]: Secondary | ICD-10-CM | POA: Diagnosis not present

## 2023-09-17 DIAGNOSIS — E538 Deficiency of other specified B group vitamins: Secondary | ICD-10-CM | POA: Diagnosis not present

## 2023-09-21 ENCOUNTER — Other Ambulatory Visit: Payer: Self-pay | Admitting: Surgery

## 2023-09-23 ENCOUNTER — Other Ambulatory Visit: Payer: Self-pay

## 2023-09-23 ENCOUNTER — Encounter
Admission: RE | Admit: 2023-09-23 | Discharge: 2023-09-23 | Disposition: A | Discharge status: Home / Self Care | Source: Ambulatory Visit | Attending: Surgery | Admitting: Surgery

## 2023-09-23 VITALS — Ht 66.0 in | Wt 143.0 lb

## 2023-09-23 DIAGNOSIS — E7849 Other hyperlipidemia: Secondary | ICD-10-CM | POA: Diagnosis not present

## 2023-09-23 DIAGNOSIS — I10 Essential (primary) hypertension: Secondary | ICD-10-CM

## 2023-09-23 DIAGNOSIS — Z01810 Encounter for preprocedural cardiovascular examination: Secondary | ICD-10-CM

## 2023-09-23 DIAGNOSIS — Z01812 Encounter for preprocedural laboratory examination: Secondary | ICD-10-CM

## 2023-09-23 HISTORY — DX: Other complications of anesthesia, initial encounter: T88.59XA

## 2023-09-23 HISTORY — DX: Palmar fascial fibromatosis (dupuytren): M72.0

## 2023-09-23 NOTE — Patient Instructions (Addendum)
 Your procedure is scheduled on: Wednesday, March 19 Report to the Registration Desk on the 1st floor of the [facility] To find out your arrival time, please call [phone] between 1PM - 3PM on: Tuesday, March 18 If your arrival time is 6:00 am, do not arrive before that time as the [facility] entrance doors do not open until 6:00 am.  REMEMBER: Instructions that are not followed completely may result in serious medical risk, up to and including death; or upon the discretion of your surgeon and anesthesiologist your surgery may need to be rescheduled.  Do not eat food after midnight the night before surgery.  No gum chewing or hard candies.  You may however, drink CLEAR liquids up to 2 hours before you are scheduled to arrive for your surgery. Do not drink anything within 2 hours of your scheduled arrival time.  Clear liquids include: - water  - apple juice without pulp - gatorade (not RED colors) - black coffee or tea (Do NOT add milk or creamers to the coffee or tea) Do NOT drink anything that is not on this list.  In addition, your doctor has ordered for you to drink the provided:  Ensure Pre-Surgery Clear Carbohydrate Drink  Drinking this carbohydrate drink up to two hours before surgery helps to reduce insulin resistance and improve patient outcomes. Please complete drinking 2 hours before scheduled arrival time.  One week prior to surgery: starting March 12 Stop aspirin and Anti-inflammatories (NSAIDS) such as Advil, Aleve, Ibuprofen, Motrin, Naproxen, Naprosyn and Aspirin based products such as Excedrin, Goody's Powder, BC Powder. Stop ANY OVER THE COUNTER supplements until after surgery.   You may however, continue to take Tylenol if needed for pain up until the day of surgery.  Continue taking all of your other prescription medications up until the day of surgery.  ON THE DAY OF SURGERY ONLY TAKE THESE MEDICATIONS WITH SIPS OF WATER:  tamsulosin (FLOMAX)   No  Alcohol for 24 hours before or after surgery.  No Smoking including e-cigarettes for 24 hours before surgery.  No chewable tobacco products for at least 6 hours before surgery.  No nicotine patches on the day of surgery.  Do not use any "recreational" drugs for at least a week (preferably 2 weeks) before your surgery.  Please be advised that the combination of cocaine and anesthesia may have negative outcomes, up to and including death. If you test positive for cocaine, your surgery will be cancelled.  On the morning of surgery brush your teeth with toothpaste and water, you may rinse your mouth with mouthwash if you wish. Do not swallow any toothpaste or mouthwash.  Use CHG Soap as directed on instruction sheet.  Do not wear jewelry, make-up, hairpins, clips or nail polish.  For welded (permanent) jewelry: bracelets, anklets, waist bands, etc.  Please have this removed prior to surgery.  If it is not removed, there is a chance that hospital personnel will need to cut it off on the day of surgery.  Do not wear lotions, powders, or perfumes.   Do not shave body hair from the neck down 48 hours before surgery.  Contact lenses, hearing aids and dentures may not be worn into surgery.  Do not bring valuables to the hospital. [facility] is not responsible for any missing/lost belongings or valuables.   Notify your doctor if there is any change in your medical condition (cold, fever, infection).  Wear comfortable clothing (specific to your surgery type) to the  hospital.  After surgery, you can help prevent lung complications by doing breathing exercises.  Take deep breaths and cough every 1-2 hours. Your doctor may order a device called an Incentive Spirometer to help you take deep breaths.  If you are being discharged the day of surgery, you will not be allowed to drive home. You will need a responsible individual to drive you home and stay with you for 24 hours after surgery.   If  you are taking public transportation, you will need to have a responsible individual with you.  Please call the Pre-admissions Testing Dept. at [phone] if you have any questions about these instructions.  Surgery Visitation Policy:  Patients having surgery or a procedure may have two visitors.  Children under the age of 67 must have an adult with them who is not the patient.  Temporary Visitor Restrictions Due to increasing cases of flu, RSV and COVID-19: Children ages 20 and under will not be able to visit patients in [facility] hospitals under most circumstances.      Preparing for Surgery with CHLORHEXIDINE GLUCONATE (CHG) Soap  Chlorhexidine Gluconate (CHG) Soap  o An antiseptic cleaner that kills germs and bonds with the skin to continue killing germs even after washing  o Used for showering the night before surgery and morning of surgery  Before surgery, you can play an important role by reducing the number of germs on your skin.  CHG (Chlorhexidine gluconate) soap is an antiseptic cleanser which kills germs and bonds with the skin to continue killing germs even after washing.  Please do not use if you have an allergy to CHG or antibacterial soaps. If your skin becomes reddened/irritated stop using the CHG.  1. Shower the NIGHT BEFORE SURGERY and the MORNING OF SURGERY with CHG soap.  2. If you choose to wash your hair, wash your hair first as usual with your normal shampoo.  3. After shampooing, rinse your hair and body thoroughly to remove the shampoo.  4. Use CHG as you would any other liquid soap. You can apply CHG directly to the skin and wash gently with a scrungie or a clean washcloth.  5. Apply the CHG soap to your body only from the neck down. Do not use on open wounds or open sores. Avoid contact with your eyes, ears, mouth, and genitals (private parts). Wash face and genitals (private parts) with your normal soap.  6. Wash thoroughly, paying special  attention to the area where your surgery will be performed.  7. Thoroughly rinse your body with warm water.  8. Do not shower/wash with your normal soap after using and rinsing off the CHG soap.  9. Pat yourself dry with a clean towel.  10. Wear clean pajamas to bed the night before surgery.  12. Place clean sheets on your bed the night of your first shower and do not sleep with pets.  13. Shower again with the CHG soap on the day of surgery prior to arriving at the hospital.  14. Do not apply any deodorants/lotions/powders.  15. Please wear clean clothes to the hospital.

## 2023-09-24 DIAGNOSIS — M720 Palmar fascial fibromatosis [Dupuytren]: Secondary | ICD-10-CM | POA: Diagnosis not present

## 2023-09-30 ENCOUNTER — Encounter: Admission: RE | Payer: Self-pay | Source: Ambulatory Visit

## 2023-09-30 ENCOUNTER — Ambulatory Visit: Admission: RE | Admit: 2023-09-30 | Source: Ambulatory Visit | Admitting: Surgery

## 2023-09-30 SURGERY — RELEASE, DUPUYTREN CONTRACTURE
Anesthesia: Choice | Site: Ring Finger | Laterality: Left

## 2023-10-19 DIAGNOSIS — E538 Deficiency of other specified B group vitamins: Secondary | ICD-10-CM | POA: Diagnosis not present

## 2023-11-13 ENCOUNTER — Encounter: Payer: Self-pay | Admitting: Urology

## 2023-11-13 ENCOUNTER — Ambulatory Visit: Payer: Self-pay | Admitting: Urology

## 2023-11-13 VITALS — BP 125/76 | HR 79 | Ht 66.0 in | Wt 140.0 lb

## 2023-11-13 DIAGNOSIS — R3129 Other microscopic hematuria: Secondary | ICD-10-CM | POA: Diagnosis not present

## 2023-11-13 DIAGNOSIS — Z125 Encounter for screening for malignant neoplasm of prostate: Secondary | ICD-10-CM

## 2023-11-13 DIAGNOSIS — N401 Enlarged prostate with lower urinary tract symptoms: Secondary | ICD-10-CM | POA: Diagnosis not present

## 2023-11-13 NOTE — Progress Notes (Signed)
 I, Maysun Jamey Mccallum, acting as a scribe for Geraline Knapp, MD., have documented all relevant documentation on the behalf of Geraline Knapp, MD, as directed by Geraline Knapp, MD while in the presence of Geraline Knapp, MD.  11/13/2023 3:51 PM   Alan Brock [birth year] [id number]  Referring provider: Yehuda Helms, MD [address] [facility] [place],  [place] [address]  Chief Complaint  Patient presents with   Elevated PSA   Benign Prostatic Hypertrophy   Urologic history 1. Microhematuria -UA 11/2022 with 3-10 RBC.  -CT urogram showed no upper tract abnormalities.  -Cystoscopy remarkable for BPH with hypervascularity.   2. BPH with LUTS  HPI: Alan Brock is a 78 y.o. male presents for annual follow-up.   No problem since last year's visit. UA with Dr. Claudius Cumins February showed no microhematuria.   PMH: Past Medical History:  Diagnosis Date   Adenomatous polyps    BPH (benign prostatic hyperplasia)    Cancer (HCC)    SKIN; face, head, arms, legs   Closed right hip fracture (HCC) 03/10/2021   Complication of anesthesia    hard to wake up from hip surgery   DDD (degenerative disc disease), cervical    Dupuytren's contracture of left hand    ring finger   Elevated PSA    Hyperlipidemia    Hypertension     Surgical History: Past Surgical History:  Procedure Laterality Date   ADENOIDECTOMY     APPENDECTOMY     CATARACT EXTRACTION W/PHACO Left 02/02/2018   Procedure: CATARACT EXTRACTION PHACO AND INTRAOCULAR LENS PLACEMENT (IOC);  Surgeon: Clair Crews, MD;  Location: [facility];  Service: Ophthalmology;  Laterality: Left;  US  00:35.6 AP% 14.4 CDE 5.12 Fluid pack lot # [id number]   COLONOSCOPY     COLONOSCOPY WITH PROPOFOL  N/A 12/03/2016   Procedure: COLONOSCOPY WITH PROPOFOL ;  Surgeon: Cassie Click, MD;  Location: [facility];  Service: Endoscopy;  Laterality: N/A;   COLONOSCOPY WITH PROPOFOL  N/A 12/17/2021   Procedure:  COLONOSCOPY WITH PROPOFOL ;  Surgeon: Marnee Sink, MD;  Location: [facility];  Service: Endoscopy;  Laterality: N/A;   ESOPHAGOGASTRODUODENOSCOPY     ESOPHAGOGASTRODUODENOSCOPY (EGD) WITH PROPOFOL   12/17/2021   Procedure: ESOPHAGOGASTRODUODENOSCOPY (EGD) WITH PROPOFOL ;  Surgeon: Marnee Sink, MD;  Location: [facility];  Service: Endoscopy;;   FLEXIBLE SIGMOIDOSCOPY     HIP ARTHROPLASTY Right 03/10/2021   Procedure: ARTHROPLASTY BIPOLAR HIP (HEMIARTHROPLASTY);  Surgeon: Marlynn Singer, MD;  Location: [facility];  Service: Orthopedics;  Laterality: Right;    Home Medications:  Allergies as of 11/13/2023   No Known Allergies      Medication List        Accurate as of Nov 13, 2023  3:51 PM. If you have any questions, ask your nurse or doctor.          aspirin  81 MG chewable tablet Chew 81 mg by mouth daily.   cholestyramine light 4 GM/DOSE powder Commonly known as: PREVALITE Take 4 g by mouth daily.   magnesium oxide 400 MG tablet Commonly known as: MAG-OX Take 400 mg by mouth 2 (two) times daily.   tamsulosin 0.4 MG Caps capsule Commonly known as: FLOMAX Take 0.4 mg by mouth daily.        Allergies: No Known Allergies  Family History: Family History  Problem Relation Age of Onset   Heart attack Mother    Heart attack Father     Social History:  reports that  he has quit smoking. His smoking use included cigarettes. His smokeless tobacco use includes snuff. He reports current alcohol use. He reports that he does not use drugs.   Physical Exam: BP 125/76   Pulse 79   Ht 5\' 6"  (1.676 m)   Wt 140 lb (63.5 kg)   BMI 22.60 kg/m   Constitutional:  Alert and oriented, No acute distress. HEENT: [place] AT, moist mucus membranes.  Trachea midline, no masses. Cardiovascular: No clubbing, cyanosis, or edema. Respiratory: Normal respiratory effort, no increased work of breathing. GI: Abdomen is soft, nontender, nondistended, no abdominal masses GU: Prostate 50 grams,  smooth without nodules Skin: No rashes, bruises or suspicious lesions. Neurologic: Grossly intact, no focal deficits, moving all 4 extremities. Psychiatric: Normal mood and affect.   Assessment & Plan:    1. BPH with LUTS Dr Claudius Cumins refills his tamsulosin  2. Microhematuria Recent negative evaluation and recent UA was negative.   3. Prostate cancer screening He had initially requested appointment last year for history elevated PSA of 4.5. Follow-up PSA was 2.59. We discussed prostate cancer screening is no longer recommended after age 45 due to the low incidence of high-grade prostate cancer in this age group.  He would like to continue annual follow-ups for a DRE.  [facility] [address], [address] [place], [place] [address] [phone]

## 2023-11-17 DIAGNOSIS — Z79899 Other long term (current) drug therapy: Secondary | ICD-10-CM | POA: Diagnosis not present

## 2023-11-17 DIAGNOSIS — N401 Enlarged prostate with lower urinary tract symptoms: Secondary | ICD-10-CM | POA: Diagnosis not present

## 2023-11-17 DIAGNOSIS — I10 Essential (primary) hypertension: Secondary | ICD-10-CM | POA: Diagnosis not present

## 2023-11-17 DIAGNOSIS — E7800 Pure hypercholesterolemia, unspecified: Secondary | ICD-10-CM | POA: Diagnosis not present

## 2023-11-17 DIAGNOSIS — E538 Deficiency of other specified B group vitamins: Secondary | ICD-10-CM | POA: Diagnosis not present

## 2023-11-17 DIAGNOSIS — Z125 Encounter for screening for malignant neoplasm of prostate: Secondary | ICD-10-CM | POA: Diagnosis not present

## 2023-11-17 DIAGNOSIS — N138 Other obstructive and reflux uropathy: Secondary | ICD-10-CM | POA: Diagnosis not present

## 2023-11-19 DIAGNOSIS — E538 Deficiency of other specified B group vitamins: Secondary | ICD-10-CM | POA: Diagnosis not present

## 2023-12-21 DIAGNOSIS — Z79899 Other long term (current) drug therapy: Secondary | ICD-10-CM | POA: Diagnosis not present

## 2023-12-21 DIAGNOSIS — R972 Elevated prostate specific antigen [PSA]: Secondary | ICD-10-CM | POA: Diagnosis not present

## 2023-12-21 DIAGNOSIS — E538 Deficiency of other specified B group vitamins: Secondary | ICD-10-CM | POA: Diagnosis not present

## 2024-01-21 DIAGNOSIS — E538 Deficiency of other specified B group vitamins: Secondary | ICD-10-CM | POA: Diagnosis not present

## 2024-02-18 DIAGNOSIS — H2511 Age-related nuclear cataract, right eye: Secondary | ICD-10-CM | POA: Diagnosis not present

## 2024-02-18 DIAGNOSIS — Z961 Presence of intraocular lens: Secondary | ICD-10-CM | POA: Diagnosis not present

## 2024-02-18 DIAGNOSIS — H43813 Vitreous degeneration, bilateral: Secondary | ICD-10-CM | POA: Diagnosis not present

## 2024-02-22 DIAGNOSIS — E538 Deficiency of other specified B group vitamins: Secondary | ICD-10-CM | POA: Diagnosis not present

## 2024-03-22 DIAGNOSIS — E538 Deficiency of other specified B group vitamins: Secondary | ICD-10-CM | POA: Diagnosis not present

## 2024-03-22 DIAGNOSIS — E782 Mixed hyperlipidemia: Secondary | ICD-10-CM | POA: Diagnosis not present

## 2024-03-22 DIAGNOSIS — Z0000 Encounter for general adult medical examination without abnormal findings: Secondary | ICD-10-CM | POA: Diagnosis not present

## 2024-03-22 DIAGNOSIS — N138 Other obstructive and reflux uropathy: Secondary | ICD-10-CM | POA: Diagnosis not present

## 2024-03-22 DIAGNOSIS — N401 Enlarged prostate with lower urinary tract symptoms: Secondary | ICD-10-CM | POA: Diagnosis not present

## 2024-03-22 DIAGNOSIS — Z1331 Encounter for screening for depression: Secondary | ICD-10-CM | POA: Diagnosis not present

## 2024-03-22 DIAGNOSIS — I10 Essential (primary) hypertension: Secondary | ICD-10-CM | POA: Diagnosis not present

## 2024-03-22 DIAGNOSIS — Z79899 Other long term (current) drug therapy: Secondary | ICD-10-CM | POA: Diagnosis not present

## 2024-03-22 DIAGNOSIS — Z125 Encounter for screening for malignant neoplasm of prostate: Secondary | ICD-10-CM | POA: Diagnosis not present

## 2024-03-24 DIAGNOSIS — E538 Deficiency of other specified B group vitamins: Secondary | ICD-10-CM | POA: Diagnosis not present

## 2024-04-25 DIAGNOSIS — E538 Deficiency of other specified B group vitamins: Secondary | ICD-10-CM | POA: Diagnosis not present

## 2024-05-26 DIAGNOSIS — E538 Deficiency of other specified B group vitamins: Secondary | ICD-10-CM | POA: Diagnosis not present

## 2024-07-20 NOTE — Progress Notes (Signed)
 Alan Brock                                          MRN: [id number]   07/20/2024   The [facility] Quality Team Specialist reviewed this patient medical record for the purposes of chart review for care gap closure. The following were reviewed: chart review for care gap closure-controlling blood pressure.    [facility] Quality Team

## 2024-11-04 ENCOUNTER — Ambulatory Visit: Admitting: Urology
